# Patient Record
Sex: Female | Born: 1980 | Race: White | Hispanic: No | Marital: Married | Smoking: Former smoker
Health system: Southern US, Community
[De-identification: ages and names within clinical notes are randomized; demographics above are authoritative.]

## PROBLEM LIST (undated history)

## (undated) DIAGNOSIS — F32A Depression, unspecified: Secondary | ICD-10-CM

## (undated) DIAGNOSIS — E785 Hyperlipidemia, unspecified: Secondary | ICD-10-CM

## (undated) DIAGNOSIS — I8290 Acute embolism and thrombosis of unspecified vein: Secondary | ICD-10-CM

## (undated) DIAGNOSIS — F329 Major depressive disorder, single episode, unspecified: Secondary | ICD-10-CM

## (undated) DIAGNOSIS — F419 Anxiety disorder, unspecified: Secondary | ICD-10-CM

## (undated) DIAGNOSIS — K219 Gastro-esophageal reflux disease without esophagitis: Secondary | ICD-10-CM

## (undated) DIAGNOSIS — I10 Essential (primary) hypertension: Secondary | ICD-10-CM

## (undated) DIAGNOSIS — F603 Borderline personality disorder: Secondary | ICD-10-CM

## (undated) DIAGNOSIS — D649 Anemia, unspecified: Secondary | ICD-10-CM

## (undated) DIAGNOSIS — F319 Bipolar disorder, unspecified: Secondary | ICD-10-CM

## (undated) DIAGNOSIS — F4310 Post-traumatic stress disorder, unspecified: Secondary | ICD-10-CM

## (undated) DIAGNOSIS — J45909 Unspecified asthma, uncomplicated: Secondary | ICD-10-CM

## (undated) HISTORY — DX: Anemia, unspecified: D64.9

## (undated) HISTORY — DX: Gastro-esophageal reflux disease without esophagitis: K21.9

## (undated) HISTORY — DX: Borderline personality disorder: F60.3

## (undated) HISTORY — DX: Bipolar disorder, unspecified: F31.9

## (undated) HISTORY — PX: ORIF ANKLE FRACTURE: SUR919

## (undated) HISTORY — PX: OTHER SURGICAL HISTORY: SHX169

## (undated) HISTORY — DX: Major depressive disorder, single episode, unspecified: F32.9

## (undated) HISTORY — DX: Hyperlipidemia, unspecified: E78.5

## (undated) HISTORY — DX: Depression, unspecified: F32.A

## (undated) HISTORY — PX: CHOLECYSTECTOMY: SHX55

---

## 2017-03-30 ENCOUNTER — Other Ambulatory Visit: Payer: Self-pay

## 2017-03-30 ENCOUNTER — Emergency Department (HOSPITAL_COMMUNITY)
Admission: EM | Admit: 2017-03-30 | Discharge: 2017-03-30 | Disposition: A | Discharge status: Home / Self Care | Payer: Self-pay | Attending: Emergency Medicine | Admitting: Emergency Medicine

## 2017-03-30 ENCOUNTER — Emergency Department (HOSPITAL_COMMUNITY): Payer: Self-pay

## 2017-03-30 ENCOUNTER — Encounter (HOSPITAL_COMMUNITY): Payer: Self-pay | Admitting: *Deleted

## 2017-03-30 DIAGNOSIS — J45909 Unspecified asthma, uncomplicated: Secondary | ICD-10-CM | POA: Insufficient documentation

## 2017-03-30 DIAGNOSIS — R0602 Shortness of breath: Secondary | ICD-10-CM | POA: Insufficient documentation

## 2017-03-30 DIAGNOSIS — I10 Essential (primary) hypertension: Secondary | ICD-10-CM | POA: Insufficient documentation

## 2017-03-30 DIAGNOSIS — R0789 Other chest pain: Secondary | ICD-10-CM | POA: Insufficient documentation

## 2017-03-30 DIAGNOSIS — F17210 Nicotine dependence, cigarettes, uncomplicated: Secondary | ICD-10-CM | POA: Insufficient documentation

## 2017-03-30 HISTORY — DX: Anxiety disorder, unspecified: F41.9

## 2017-03-30 HISTORY — DX: Post-traumatic stress disorder, unspecified: F43.10

## 2017-03-30 HISTORY — DX: Essential (primary) hypertension: I10

## 2017-03-30 HISTORY — DX: Acute embolism and thrombosis of unspecified vein: I82.90

## 2017-03-30 HISTORY — DX: Unspecified asthma, uncomplicated: J45.909

## 2017-03-30 LAB — CBC
HEMATOCRIT: 38.4 % (ref 36.0–46.0)
HEMOGLOBIN: 12.5 g/dL (ref 12.0–15.0)
MCH: 28.9 pg (ref 26.0–34.0)
MCHC: 32.6 g/dL (ref 30.0–36.0)
MCV: 88.9 fL (ref 78.0–100.0)
Platelets: 294 10*3/uL (ref 150–400)
RBC: 4.32 MIL/uL (ref 3.87–5.11)
RDW: 13.6 % (ref 11.5–15.5)
WBC: 10.9 10*3/uL — ABNORMAL HIGH (ref 4.0–10.5)

## 2017-03-30 LAB — BASIC METABOLIC PANEL
ANION GAP: 8 (ref 5–15)
BUN: 12 mg/dL (ref 6–20)
CHLORIDE: 103 mmol/L (ref 101–111)
CO2: 26 mmol/L (ref 22–32)
CREATININE: 0.77 mg/dL (ref 0.44–1.00)
Calcium: 9.5 mg/dL (ref 8.9–10.3)
GFR calc non Af Amer: >60 mL/min (ref >60)
Glucose, Bld: 121 mg/dL — ABNORMAL HIGH (ref 65–99)
Potassium: 3.7 mmol/L (ref 3.5–5.1)
Sodium: 137 mmol/L (ref 135–145)

## 2017-03-30 LAB — BRAIN NATRIURETIC PEPTIDE: B NATRIURETIC PEPTIDE 5: 4 pg/mL (ref 0.0–100.0)

## 2017-03-30 LAB — I-STAT TROPONIN, ED
TROPONIN I, POC: 0 ng/mL (ref 0.00–0.08)
Troponin i, poc: 0.02 ng/mL (ref 0.00–0.08)

## 2017-03-30 LAB — I-STAT BETA HCG BLOOD, ED (MC, WL, AP ONLY): I-stat hCG, quantitative: <5 m[IU]/mL (ref <5)

## 2017-03-30 LAB — D-DIMER, QUANTITATIVE (NOT AT ARMC)

## 2017-03-30 MED ORDER — GI COCKTAIL ~~LOC~~
30.0000 mL | Freq: Once | ORAL | Status: AC
  Administered 2017-03-30: 30 mL via ORAL
  Filled 2017-03-30: qty 30

## 2017-03-30 MED ORDER — FAMOTIDINE 20 MG PO TABS
20.0000 mg | ORAL_TABLET | Freq: Once | ORAL | Status: AC
  Administered 2017-03-30: 20 mg via ORAL
  Filled 2017-03-30: qty 1

## 2017-03-30 MED ORDER — OXYCODONE-ACETAMINOPHEN 5-325 MG PO TABS
1.0000 | ORAL_TABLET | Freq: Once | ORAL | Status: AC
  Administered 2017-03-30: 1 via ORAL
  Filled 2017-03-30: qty 1

## 2017-03-30 NOTE — ED Triage Notes (Signed)
Pt c/o left side chest pain and lower back pain with some jaw pain; pt states her left hand "feels funny"

## 2017-03-30 NOTE — ED Provider Notes (Signed)
[facility] EMERGENCY DEPARTMENT Provider Note   CSN: [id number] Arrival date & time: 03/30/17  0000     History   Chief Complaint Chief Complaint  Patient presents with  . Chest Pain    HPI Rita Browning is a 36 y.o. female.  Patient presents to the ER for evaluation of chest pain.  Patient reports that symptoms began 30-45 minutes ago while laying in bed.  Initially it was sharp stabbing pains, now more of a pressure over the left central area of her chest.  She reports some radiation to the neck area as well as a headache.  She has started to have tingling in her left hand.  She feels mildly short of breath.  Patient denies any history of heart disease in her immediate family.  She is a smoker, is obese, has previously been treated for hypertension, but was taken off of her meds.      Past Medical History:  Diagnosis Date  . Anxiety   . Asthma   . Blood clot in vein right leg  . Hypertension   . PTSD (post-traumatic stress disorder)     There are no active problems to display for this patient.   Past Surgical History:  Procedure Laterality Date  . blood clot removed from right leg    . CHOLECYSTECTOMY    . ORIF ANKLE FRACTURE Right     OB History    No data available       Home Medications    Prior to Admission medications   Not on File    Family History History reviewed. No pertinent family history.  Social History Social History   Tobacco Use  . Smoking status: Current Every Day Smoker  . Smokeless tobacco: Never Used  . Tobacco comment: vape  Substance Use Topics  . Alcohol use: No    Frequency: Never  . Drug use: No     Allergies   Patient has no known allergies.   Review of Systems Review of Systems  Respiratory: Positive for shortness of breath.   Cardiovascular: Positive for chest pain.  Gastrointestinal: Positive for nausea.  All other systems reviewed and are negative.    Physical Exam Updated Vital Signs BP 104/61    Pulse 86   Temp 97.9 F (36.6 C) (Oral)   Resp 20   Ht 5\' 8"  (1.727 m)   Wt (!) 149.7 kg (330 lb)   LMP 03/08/2017   SpO2 98%   BMI 50.18 kg/m   Physical Exam  Constitutional: She is oriented to person, place, and time. She appears well-developed and well-nourished. No distress.  HENT:  Head: Normocephalic and atraumatic.  Right Ear: Hearing normal.  Left Ear: Hearing normal.  Nose: Nose normal.  Mouth/Throat: Oropharynx is clear and moist and mucous membranes are normal.  Eyes: Conjunctivae and EOM are normal. Pupils are equal, round, and reactive to light.  Neck: Normal range of motion. Neck supple.  Cardiovascular: Regular rhythm, S1 normal and S2 normal. Exam reveals no gallop and no friction rub.  No murmur heard. Pulmonary/Chest: Effort normal and breath sounds normal. No respiratory distress. She exhibits no tenderness.  Abdominal: Soft. Normal appearance and bowel sounds are normal. There is no hepatosplenomegaly. There is no tenderness. There is no rebound, no guarding, no tenderness at McBurney's point and negative Murphy's sign. No hernia.  Musculoskeletal: Normal range of motion.  Neurological: She is alert and oriented to person, place, and time. She has normal strength. No cranial nerve  deficit or sensory deficit. Coordination normal. GCS eye subscore is 4. GCS verbal subscore is 5. GCS motor subscore is 6.  Skin: Skin is warm, dry and intact. No rash noted. No cyanosis.  Psychiatric: She has a normal mood and affect. Her speech is normal and behavior is normal. Thought content normal.  Nursing note and vitals reviewed.    ED Treatments / Results  Labs (all labs ordered are listed, but only abnormal results are displayed) Labs Reviewed  BASIC METABOLIC PANEL - Abnormal; Notable for the following components:      Result Value   Glucose, Bld 121 (*)    All other components within normal limits  CBC - Abnormal; Notable for the following components:   WBC 10.9 (*)     All other components within normal limits  BRAIN NATRIURETIC PEPTIDE  D-DIMER, QUANTITATIVE (NOT AT [facility])  I-STAT TROPONIN, ED  I-STAT BETA HCG BLOOD, ED ([facility], [facility], [facility] ONLY)  I-STAT TROPONIN, ED    EKG  EKG Interpretation  Date/Time:  Monday March 30 2017 00:09:43 EST Ventricular Rate:  103 PR Interval:    QRS Duration: 82 QT Interval:  348 QTC Calculation: 456 R Axis:   54 Text Interpretation:  Sinus tachycardia Anteroseptal infarct, old Confirmed by Orpah Greek [phone]) on 03/30/2017 12:42:28 AM       Radiology Dg Chest 2 View  Result Date: 03/30/2017 CLINICAL DATA:  36 year old female with chest pain. EXAM: CHEST  2 VIEW COMPARISON:  None. FINDINGS: The heart size and mediastinal contours are within normal limits. Both lungs are clear. The visualized skeletal structures are unremarkable. IMPRESSION: No active cardiopulmonary disease. Electronically Signed   By: Anner Crete M.D.   On: 03/30/2017 01:13    Procedures Procedures (including critical care time)  Medications Ordered in ED Medications  oxyCODONE-acetaminophen (PERCOCET/ROXICET) 5-325 MG per tablet 1 tablet (1 tablet Oral Given 03/30/17 0135)  gi cocktail (Maalox,Lidocaine,Donnatal) (30 mLs Oral Given 03/30/17 0135)  famotidine (PEPCID) tablet 20 mg (20 mg Oral Given 03/30/17 0135)     Initial Impression / Assessment and Plan / ED Course  I have reviewed the triage vital signs and the nursing notes.  Pertinent labs & imaging results that were available during my care of the patient were reviewed by me and considered in my medical decision making (see chart for details).     Patient presents with complaints of chest pain.  She had onset of pain at rest.  Features are mixed typical and possible typical.  She has not had any exertional component, but I cannot reproduce the pain.  Pain started as a sharp pain but then she reported more of a heaviness.  Here in the ER she has having intermittent  paroxysms of pain that only lasts 10 or 15 seconds, atypical for cardiac etiology.  She does not have a family history of heart disease, but does have some history of hypertension.  She does have a history of DVT, but currently does not have any unilateral swelling, tachycardia, tachypnea or hypoxia.  D-dimer is normal.  Pretest probability was felt to be low for PE, this is reassuring that she does not have a PE currently.  Initial troponin and EKG unremarkable.  She is felt to be in the low risk category for cardiac etiology.  She was held in the ER, a second troponin has been performed and is negative.  She is felt to have been adequately worked up here in the ER, does not require hospitalization  for further management.  Can follow-up with primary care.  HEART Score for Major Cardiac Events from [email or web address]  on 03/30/2017 ** All calculations should be rechecked by clinician prior to use **  RESULT SUMMARY: 3 points Low Score (0-3 points)  Risk of MACE of 0.9-1.7%.   INPUTS: History -> 1 = Moderately suspicious EKG -> 0 = Normal Age -> 0 = <45 Risk factors -> 2 = ?3 risk factors or history of atherosclerotic disease Initial troponin -> 0 = ?normal limit  Final Clinical Impressions(s) / ED Diagnoses   Final diagnoses:  Atypical chest pain    ED Discharge Orders    None       Orpah Greek, MD 03/30/17 [phone]

## 2017-06-08 ENCOUNTER — Encounter (HOSPITAL_COMMUNITY): Payer: Self-pay | Admitting: Emergency Medicine

## 2017-06-08 ENCOUNTER — Other Ambulatory Visit: Payer: Self-pay

## 2017-06-08 ENCOUNTER — Emergency Department (HOSPITAL_COMMUNITY)
Admission: EM | Admit: 2017-06-08 | Discharge: 2017-06-09 | Disposition: A | Discharge status: Other Acute Inpatient Hospital | Payer: Self-pay | Attending: Emergency Medicine | Admitting: Emergency Medicine

## 2017-06-08 DIAGNOSIS — R45851 Suicidal ideations: Secondary | ICD-10-CM | POA: Insufficient documentation

## 2017-06-08 DIAGNOSIS — F332 Major depressive disorder, recurrent severe without psychotic features: Secondary | ICD-10-CM | POA: Insufficient documentation

## 2017-06-08 DIAGNOSIS — F17200 Nicotine dependence, unspecified, uncomplicated: Secondary | ICD-10-CM | POA: Insufficient documentation

## 2017-06-08 DIAGNOSIS — J45909 Unspecified asthma, uncomplicated: Secondary | ICD-10-CM | POA: Insufficient documentation

## 2017-06-08 DIAGNOSIS — I10 Essential (primary) hypertension: Secondary | ICD-10-CM | POA: Insufficient documentation

## 2017-06-08 LAB — COMPREHENSIVE METABOLIC PANEL
ALBUMIN: 4.2 g/dL (ref 3.5–5.0)
ALK PHOS: 62 U/L (ref 38–126)
ALT: 92 U/L — AB (ref 14–54)
ANION GAP: 12 (ref 5–15)
AST: 93 U/L — AB (ref 15–41)
BUN: 9 mg/dL (ref 6–20)
CALCIUM: 9.5 mg/dL (ref 8.9–10.3)
CO2: 21 mmol/L — ABNORMAL LOW (ref 22–32)
CREATININE: 0.81 mg/dL (ref 0.44–1.00)
Chloride: 103 mmol/L (ref 101–111)
GFR calc Af Amer: >60 mL/min (ref >60)
GFR calc non Af Amer: >60 mL/min (ref >60)
GLUCOSE: 117 mg/dL — AB (ref 65–99)
Potassium: 3.9 mmol/L (ref 3.5–5.1)
Sodium: 136 mmol/L (ref 135–145)
Total Bilirubin: 0.5 mg/dL (ref 0.3–1.2)
Total Protein: 8.3 g/dL — ABNORMAL HIGH (ref 6.5–8.1)

## 2017-06-08 LAB — ACETAMINOPHEN LEVEL

## 2017-06-08 LAB — RAPID URINE DRUG SCREEN, HOSP PERFORMED
Amphetamines: NOT DETECTED
BARBITURATES: NOT DETECTED
Benzodiazepines: NOT DETECTED
COCAINE: NOT DETECTED
Opiates: NOT DETECTED
TETRAHYDROCANNABINOL: NOT DETECTED

## 2017-06-08 LAB — CBC
HEMATOCRIT: 39.6 % (ref 36.0–46.0)
HEMOGLOBIN: 13.3 g/dL (ref 12.0–15.0)
MCH: 29.4 pg (ref 26.0–34.0)
MCHC: 33.6 g/dL (ref 30.0–36.0)
MCV: 87.6 fL (ref 78.0–100.0)
Platelets: 406 10*3/uL — ABNORMAL HIGH (ref 150–400)
RBC: 4.52 MIL/uL (ref 3.87–5.11)
RDW: 13.9 % (ref 11.5–15.5)
WBC: 8.5 10*3/uL (ref 4.0–10.5)

## 2017-06-08 LAB — SALICYLATE LEVEL: Salicylate Lvl: <7 mg/dL (ref 2.8–30.0)

## 2017-06-08 LAB — PREGNANCY, URINE: Preg Test, Ur: NEGATIVE

## 2017-06-08 LAB — ETHANOL: Alcohol, Ethyl (B): <10 mg/dL (ref <10)

## 2017-06-08 NOTE — ED Notes (Signed)
Pt's belongings and purse placed in psych lockers.

## 2017-06-08 NOTE — ED Notes (Signed)
Security called to wand pt  

## 2017-06-08 NOTE — ED Provider Notes (Signed)
[facility] EMERGENCY DEPARTMENT Provider Note   CSN: [id number] Arrival date & time: 06/08/17  1416     History   Chief Complaint Chief Complaint  Patient presents with  . V70.1    HPI Rita Browning is a 37 y.o. female.  HPI  The patient is a 37 year old female, she has a known history of anxiety depression and posttraumatic stress disorder which she has suffered with for the last 10 years.  She has been following with a therapist recently because of seemingly uncontrolled anxiety and depression and when she went to the office today complaining of increasing suicidal thoughts including putting a gun to her head or her chest (has a gun at home) her therapist sent her to the emergency department for evaluation.  The patient does report having a suicide attempt about a year and a half ago where she was going to overdose until her roommate stopped it.  She reports that she has been having thoughts recently of increasing suicidal nature stating that she thinks other people would be better off without her, she reports that her significant other who is here with her has been staying with her 24 hours a day to prevent her from doing something dangerous.  She does not have any visual hallucinations but states that she feels like voices are telling her to tell us that everything is okay so she can leave and finish her suicidal act.  She denies alcohol or drug use, she does not smoke cigarettes but currently vapes  Past Medical History:  Diagnosis Date  . Anxiety   . Asthma   . Blood clot in vein right leg  . Hypertension   . PTSD (post-traumatic stress disorder)     There are no active problems to display for this patient.   Past Surgical History:  Procedure Laterality Date  . blood clot removed from right leg    . CHOLECYSTECTOMY    . ORIF ANKLE FRACTURE Right     OB History    No data available       Home Medications    Prior to Admission medications   Not on File     Family History History reviewed. No pertinent family history.  Social History Social History   Tobacco Use  . Smoking status: Current Every Day Smoker  . Smokeless tobacco: Never Used  . Tobacco comment: vape  Substance Use Topics  . Alcohol use: No    Frequency: Never  . Drug use: No     Allergies   Codeine   Review of Systems Review of Systems  All other systems reviewed and are negative.    Physical Exam Updated Vital Signs BP 120/74 (BP Location: Right Arm)   Pulse 92   Temp 98.2 F (36.8 C) (Oral)   Resp 18   Ht 5\' 8"  (1.727 m)   Wt (!) 150.1 kg (331 lb)   LMP 06/08/2017   SpO2 99%   BMI 50.33 kg/m   Physical Exam  Constitutional: She appears well-developed and well-nourished. No distress.  HENT:  Head: Normocephalic and atraumatic.  Mouth/Throat: Oropharynx is clear and moist. No oropharyngeal exudate.  Eyes: Conjunctivae and EOM are normal. Pupils are equal, round, and reactive to light. Right eye exhibits no discharge. Left eye exhibits no discharge. No scleral icterus.  Neck: Normal range of motion. Neck supple. No JVD present. No thyromegaly present.  Cardiovascular: Normal rate, regular rhythm, normal heart sounds and intact distal pulses. Exam reveals no gallop and  no friction rub.  No murmur heard. Pulmonary/Chest: Effort normal and breath sounds normal. No respiratory distress. She has no wheezes. She has no rales.  Abdominal: Soft. Bowel sounds are normal. She exhibits no distension and no mass. There is no tenderness.  Musculoskeletal: Normal range of motion. She exhibits no edema or tenderness.  Lymphadenopathy:    She has no cervical adenopathy.  Neurological: She is alert. Coordination normal.  Skin: Skin is warm and dry. No rash noted. No erythema.  Psychiatric:  Flat affect Depressed appearance Not responding to internal stimuli No pressured speech Clear train of thought.  Nursing note and vitals reviewed.    ED Treatments  / Results  Labs (all labs ordered are listed, but only abnormal results are displayed) Labs Reviewed  COMPREHENSIVE METABOLIC PANEL - Abnormal; Notable for the following components:      Result Value   CO2 21 (*)    Glucose, Bld 117 (*)    Total Protein 8.3 (*)    AST 93 (*)    ALT 92 (*)    All other components within normal limits  ACETAMINOPHEN LEVEL - Abnormal; Notable for the following components:   Acetaminophen (Tylenol), Serum <10 (*)    All other components within normal limits  CBC - Abnormal; Notable for the following components:   Platelets 406 (*)    All other components within normal limits  ETHANOL  SALICYLATE LEVEL  RAPID URINE DRUG SCREEN, HOSP PERFORMED  PREGNANCY, URINE    EKG  EKG Interpretation None       Radiology No results found.  Procedures Procedures (including critical care time)  Medications Ordered in ED Medications - No data to display   Initial Impression / Assessment and Plan / ED Course  I have reviewed the triage vital signs and the nursing notes.  Pertinent labs & imaging results that were available during my care of the patient were reviewed by me and considered in my medical decision making (see chart for details).  Clinical Course as of Jun 08 2157  Mon Jun 08, 2017  1711 The lab work has returned, there is no acute findings to suggest the need for medical stabilizing care.  She is medically cleared to speak with psychiatry as of 5:00 PM  [BM]    Clinical Course User Index [BM] Noemi Chapel, MD    The patient is actively suicidal with a plan, she will need to be admitted to a psychiatric institution for stabilizing care, until then she will remain here under psychiatric care in the emergency department with a sitter at the bedside under suicide precautions.   Final Clinical Impressions(s) / ED Diagnoses   Final diagnoses:  Suicidal thoughts  Severe episode of recurrent major depressive disorder, without psychotic  features ([place])      Noemi Chapel, MD 06/08/17 2159
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
## 2017-06-08 NOTE — ED Notes (Signed)
Pt visitor was given pt purse,cellphone, silver wedding band, clothes, shoes. Pt family member reported would bring pt belongings home with her at time of leaving ED. Pt belongings currently at nurses station with Primary RN. Sitter and Primary RN aware.

## 2017-06-08 NOTE — ED Notes (Signed)
Pt given phone to use 

## 2017-06-08 NOTE — ED Triage Notes (Signed)
Pt reports suicidal thoughts since Thursday. Pt reports was seen by psychiatrist today and sent over for evaluation. Pt reports "I know how to do it the right way, if someone would just leave me alone." pt family reports has removed guns from home. Pt tearful and sad in triage. Pt denies AVH/HI.

## 2017-06-08 NOTE — ED Notes (Signed)
Informed pt that [facility] adult unit was currently full per [facility] Flow and that they were seeking placement for her.

## 2017-06-09 ENCOUNTER — Inpatient Hospital Stay (HOSPITAL_COMMUNITY)
Admission: AD | Admit: 2017-06-09 | Discharge: 2017-06-12 | DRG: 885 | Disposition: A | Discharge status: Home / Self Care | Payer: Federal, State, Local not specified - Other | Source: Intra-hospital | Attending: Psychiatry | Admitting: Psychiatry

## 2017-06-09 ENCOUNTER — Encounter (HOSPITAL_COMMUNITY): Payer: Self-pay

## 2017-06-09 DIAGNOSIS — F3181 Bipolar II disorder: Secondary | ICD-10-CM | POA: Diagnosis not present

## 2017-06-09 DIAGNOSIS — F332 Major depressive disorder, recurrent severe without psychotic features: Secondary | ICD-10-CM | POA: Diagnosis present

## 2017-06-09 DIAGNOSIS — R45851 Suicidal ideations: Secondary | ICD-10-CM | POA: Diagnosis present

## 2017-06-09 DIAGNOSIS — F17290 Nicotine dependence, other tobacco product, uncomplicated: Secondary | ICD-10-CM | POA: Diagnosis present

## 2017-06-09 DIAGNOSIS — Z62810 Personal history of physical and sexual abuse in childhood: Secondary | ICD-10-CM | POA: Diagnosis present

## 2017-06-09 DIAGNOSIS — Z885 Allergy status to narcotic agent status: Secondary | ICD-10-CM

## 2017-06-09 DIAGNOSIS — Z818 Family history of other mental and behavioral disorders: Secondary | ICD-10-CM | POA: Diagnosis not present

## 2017-06-09 DIAGNOSIS — F4310 Post-traumatic stress disorder, unspecified: Secondary | ICD-10-CM | POA: Diagnosis present

## 2017-06-09 DIAGNOSIS — F419 Anxiety disorder, unspecified: Secondary | ICD-10-CM | POA: Diagnosis present

## 2017-06-09 DIAGNOSIS — I10 Essential (primary) hypertension: Secondary | ICD-10-CM | POA: Diagnosis present

## 2017-06-09 MED ORDER — TRAZODONE HCL 50 MG PO TABS
50.0000 mg | ORAL_TABLET | Freq: Every evening | ORAL | Status: DC | PRN
  Administered 2017-06-10 – 2017-06-11 (×2): 50 mg via ORAL
  Filled 2017-06-09 (×8): qty 1

## 2017-06-09 MED ORDER — PRAZOSIN HCL 2 MG PO CAPS
2.0000 mg | ORAL_CAPSULE | Freq: Every day | ORAL | Status: DC
  Filled 2017-06-09: qty 1
  Filled 2017-06-09: qty 2
  Filled 2017-06-09 (×3): qty 1

## 2017-06-09 MED ORDER — ACETAMINOPHEN 325 MG PO TABS: 650.0000 mg | ORAL_TABLET | Freq: Four times a day (QID) | ORAL | Status: DC | PRN

## 2017-06-09 MED ORDER — MAGNESIUM HYDROXIDE 400 MG/5ML PO SUSP: 30.0000 mL | Freq: Every day | ORAL | Status: DC | PRN

## 2017-06-09 MED ORDER — HYDROXYZINE HCL 25 MG PO TABS
25.0000 mg | ORAL_TABLET | Freq: Four times a day (QID) | ORAL | Status: DC | PRN
  Filled 2017-06-09: qty 10

## 2017-06-09 MED ORDER — LITHIUM CARBONATE 300 MG PO CAPS
900.0000 mg | ORAL_CAPSULE | Freq: Every day | ORAL | Status: DC
  Administered 2017-06-09: 900 mg via ORAL
  Filled 2017-06-09 (×4): qty 3

## 2017-06-09 MED ORDER — ALUM & MAG HYDROXIDE-SIMETH 200-200-20 MG/5ML PO SUSP: 30.0000 mL | ORAL | Status: DC | PRN

## 2017-06-09 NOTE — BHH Suicide Risk Assessment (Signed)
[facility] Admission Suicide Risk Assessment   Nursing information obtained from:    Demographic factors:    Current Mental Status:    Loss Factors:    Historical Factors:    Risk Reduction Factors:     Total Time spent with patient: 45 minutes Principal Problem: <principal problem not specified> Diagnosis:   Patient Active Problem List   Diagnosis Date Noted  . MDD (major depressive disorder), recurrent episode, severe ([place]) [F33.2] 06/09/2017   Subjective Data:  37 y.o Caucasian female, unemployed, lives with her partner. Background history of  Bipolar Disorder, early life trauma and self mutilation. Presented to the ER voluntarily in company of her partner. Expressed worsening depression in the past couple of days. Has been having more severe suicidal thoughts. Has thought about shooting herself, taking and OD and cutting herself. Family has secured all the weapons in the house. Main stressor is not being able to hold a job. Routine labs significant for mildly elevated AST and ALT, thrombocytosis. Toxicology is negative. UDS is is negative. BAL <10 mg/dl. History of early life trauma. History of self mutilation in the past. No past suicidal behavior, no family history of suicide, no evidence of psychosis. No evidence of mania. No cognitive impairment. No access to weapons. She is cooperative with care. She has agreed to treatment recommendations. She has agreed to communicate suicidal thoughts to staff if the thoughts becomes overwhelming.      Continued Clinical Symptoms:    The "Alcohol Use Disorders Identification Test", Guidelines for Use in Primary Care, Second Edition.  World Pharmacologist [facility]). Score between 0-7:  no or low risk or alcohol related problems. Score between 8-15:  moderate risk of alcohol related problems. Score between 16-19:  high risk of alcohol related problems. Score 20 or above:  warrants further diagnostic evaluation for alcohol dependence and  treatment.   CLINICAL FACTORS:   Bipolar Disorder:   Mixed State   Musculoskeletal: Strength & Muscle Tone: within normal limits Gait & Station: normal Patient leans: N/A  Psychiatric Specialty Exam: Physical Exam  ROS  Blood pressure 119/80, pulse (!) 102, temperature (!) 97.5 F (36.4 C), temperature source Oral, resp. rate 18, height 5' 7.99" (1.727 m), weight (!) 150.1 kg (330 lb 14.6 oz), last menstrual period 06/08/2017, SpO2 100 %.Body mass index is 50.33 kg/m.  General Appearance: As in H&P  Eye Contact:    Speech:    Volume:    Mood:    Affect:    Thought Process:    Orientation:    Thought Content:    Suicidal Thoughts:    Homicidal Thoughts:    Memory:    Judgement:  As in H&P  Insight:    Psychomotor Activity:    Concentration:    Recall:    Fund of Knowledge:    Language:    Akathisia:    Handed:    AIMS (if indicated):     Assets:    ADL's:    Cognition:  As in H&P  Sleep:  Number of Hours: 3.25      COGNITIVE FEATURES THAT CONTRIBUTE TO RISK:      SUICIDE RISK:   Moderate:  Frequent suicidal ideation with limited intensity, and duration, some specificity in terms of plans, no associated intent, good self-control, limited dysphoria/symptomatology, some risk factors present, and identifiable protective factors, including available and accessible social support.  PLAN OF CARE:  As in H&P  I certify that inpatient services furnished can reasonably be expected  to improve the patient's condition.   Artist Beach, MD 06/09/2017, 4:31 PM

## 2017-06-09 NOTE — Progress Notes (Signed)
Recreation Therapy Notes  Date: 2.12.19 Time: 2:45 pm  Location: [address]   AAA/T Program Assumption of Risk Form signed by Patient/ or Parent Legal Guardian Yes  Patient is free of allergies or sever asthma Yes  Patient reports no fear of animals Yes  Patient reports no history of cruelty to animals Yes  Patient understands his/her participation is voluntary Yes  Patient washes hands before animal contact Yes  Patient washes hands after animal contact Yes  Behavioral Response: Engaged   Education:Hand Washing, Appropriate Animal Interaction   Education Outcome: Acknowledges education.   Clinical Observations/Feedback: Patient attended session and interacted appropriately with therapy dog and peers. Patient asked appropriate questions about therapy dog and his training. Patient shared stories about their pets at home with group.   Ranell Patrick, Recreation Therapy Intern   Ranell Patrick 06/09/2017 8:47 AM

## 2017-06-09 NOTE — ED Notes (Signed)
Pt just left for [facility] with [facility] transport. Report called to [place] @ [facility].
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
## 2017-06-09 NOTE — BHH Group Notes (Signed)
[place] Group Notes:  (Nursing/MHT/Case Management/Adjunct)  Date:  06/09/2017  Time:  5:34 PM  Type of Therapy:  Nurse Education  Participation Level:  Active  Participation Quality:  Appropriate  Affect:  Appropriate  Cognitive:  Appropriate  Insight:  Appropriate  Engagement in Group:  Engaged  Modes of Intervention:  Activity and Education  Summary of Progress/Problems:  This was a Psychoeducational group specifically related to the use of the therapy ball.    Cheri Kearns 06/09/2017, 5:34 PM
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
## 2017-06-09 NOTE — BHH Group Notes (Signed)
Adult Psychoeducational Group Note  Date:  06/09/2017 Time:  9:01 AM  Group Topic/Focus:  Goals Group:   The focus of this group is to help patients establish daily goals to achieve during treatment and discuss how the patient can incorporate goal setting into their daily lives to aide in recovery.  Participation Level:  Active  Participation Quality:  Appropriate  Affect:  Appropriate  Cognitive:  Alert  Insight: Appropriate  Engagement in Group:  Engaged  Modes of Intervention:  Orientation  Additional Comments:  Pt was alert and engaged in group. Pt goal for today is to "survive the day".   Huel Cote 06/09/2017, 9:01 AM

## 2017-06-09 NOTE — Progress Notes (Addendum)
D: Patient is irritable and sarcastic.  She states, "Well, what do I do today?  Just lay around?"  Patient was informed they would announce groups at different times during the day.  She states, "what fun, fun."  Patient presents superficially; she is sullen.  She denies any thoughts of self harm.  Patient is considerably more pleasant this afternoon.  She is concerned about taking lithium.  She states, "I'm willing to stay longer if they need to draw my labs.  I'm still having thoughts of hurting myself."  A: Continue to monitor medication management and MD orders.  Safety checks continued every 15 minutes per protocol.  Offer support and encouragement as needed.  R: Patient is encouraged to attend groups and participate in her treatment.

## 2017-06-09 NOTE — BHH Group Notes (Signed)
LCSW Group Therapy Note 06/09/2017 2:56 PM  Type of Therapy/Topic: Group Therapy: Feelings about Diagnosis  Participation Level: Active   Description of Group:  This group will allow patients to explore their thoughts and feelings about diagnoses they have received. Patients will be guided to explore their level of understanding and acceptance of these diagnoses. Facilitator will encourage patients to process their thoughts and feelings about the reactions of others to their diagnosis and will guide patients in identifying ways to discuss their diagnosis with significant others in their lives. This group will be process-oriented, with patients participating in exploration of their own experiences, giving and receiving support, and processing challenge from other group members.  Therapeutic Goals: 1. Patient will demonstrate understanding of diagnosis as evidenced by identifying two or more symptoms of the disorder 2. Patient will be able to express two feelings regarding the diagnosis 3. Patient will demonstrate their ability to communicate their needs through discussion and/or role play  Summary of Patient Progress:  Rita Browning was engaged throughout the group's session. Rita Browning participated and contributed to the group's discussion. Rita Browning states that she recommends that mental health should be implemented in the school system as a way to better educate society on mental health diagnosis and mental health issues.   Therapeutic Modalities:  Cognitive Behavioral Therapy Brief Therapy Feelings Identification    [place] Clinical Social Worker

## 2017-06-09 NOTE — Progress Notes (Signed)
Nursing Progress Note: 7p-7a D: Pt currently presents with a anxious/pleasant affect and behavior. Pt states "I feel like that Lithium dose is really high. I'm nervous about that because I've heard of people OD'ing on it. I'm just glad I'm here where I couldn't have the option to OD." Interacting appropriately with the milieu. Pt reports good sleep during the previous night with current medication regimen. Pt did attend wrap-up group.  A: Pt provided with medications per providers orders. Pt's labs and vitals were monitored throughout the night. Pt supported emotionally and encouraged to express concerns and questions. Pt educated on medications.  R: Pt's safety ensured with 15 minute and environmental checks. Pt currently denies SI, HI, and AVH. Pt verbally contracts to seek staff if SI,HI, or AVH occurs and to consult with staff before acting on any harmful thoughts. Will continue to monitor.

## 2017-06-09 NOTE — Tx Team (Signed)
Initial Treatment Plan 06/09/2017 2:17 AM Gara Kroner MMN:[id number]    PATIENT STRESSORS: Health problems   PATIENT STRENGTHS: Ability for insight Average or above average intelligence Capable of independent living   PATIENT IDENTIFIED PROBLEMS: "medications to stabilize mood"  "Coping skills for depression and suicidal thoughts"                   DISCHARGE CRITERIA:  Ability to meet basic life and health needs Adequate post-discharge living arrangements Improved stabilization in mood, thinking, and/or behavior  PRELIMINARY DISCHARGE PLAN: Attend aftercare/continuing care group  PATIENT/FAMILY INVOLVEMENT: This treatment plan has been presented to and reviewed with the patient, Rita Browning.  The patient and family have been given the opportunity to ask questions and make suggestions.  Gwendolyn Fill, RN 06/09/2017, 2:17 AM

## 2017-06-09 NOTE — BHH Group Notes (Signed)
Adult Psychoeducational Group Note  Date:  06/09/2017 Time:  7:15 PM  Group Topic/Focus:  Activity  Participation Level:  Active  Additional Comments:  Pt participated in [facility] activity    Huel Cote 06/09/2017, 7:15 PM

## 2017-06-10 DIAGNOSIS — F3181 Bipolar II disorder: Secondary | ICD-10-CM

## 2017-06-10 LAB — TSH: TSH: 2.019 u[IU]/mL (ref 0.350–4.500)

## 2017-06-10 MED ORDER — RISPERIDONE 0.5 MG PO TABS
0.5000 mg | ORAL_TABLET | Freq: Every day | ORAL | Status: DC
  Administered 2017-06-10: 0.5 mg via ORAL
  Filled 2017-06-10 (×2): qty 1

## 2017-06-10 NOTE — Progress Notes (Signed)
Recreation Therapy Notes  Date: 06/10/17 Time: 0930 Location: [address] Dayroom  Group Topic: Stress Management  Goal Area(s) Addresses:  Patient will verbalize importance of using healthy stress management.  Patient will identify positive emotions associated with healthy stress management.   Intervention: Stress Management  Activity :  LRT introduced the stress management technique of guided imagery.  LRT read a script to guide patients to envision their peaceful place. Patients were to follow along as LRT read script.  Education:  Stress Management, Discharge Planning.   Education Outcome: Acknowledges edcuation/In group clarification offered/Needs additional education  Clinical Observations/Feedback: Pt did not attend group.    Victorino Sparrow, LRT/CTRS         Victorino Sparrow A 06/10/2017 11:38 AM

## 2017-06-10 NOTE — Progress Notes (Signed)
D: Patient is bright with pleasant mood.  She was concerned about taking the lithium and did not want to take it again.  Patient was taken off the lithium.  Patient denies any thoughts of self harm today.  She states, "I just want to get better."  She frequently asks about her diagnosis.  She rates her depression as a 5; hopelessness and anxiety as a 2.  She is sleeping and eating well; her energy level is low and her concentration is poor.  She is attending groups and is an active participant.  A: Continue to monitor medication management and MD orders.  Safety checks continued every 15 minutes per protocol.  Offer encouragement and support as needed.  R: Patient is receptive to staff; her behavior is appropriate.

## 2017-06-10 NOTE — BHH Group Notes (Signed)
[facility] Mental Health Association Group Therapy      06/10/2017 12:13 PM  Type of Therapy: Mental Health Association Presentation  Participation Level: Active  Participation Quality: Attentive  Affect: Appropriate  Cognitive: Oriented  Insight: Developing/Improving  Engagement in Therapy: Engaged  Modes of Intervention: Discussion, Education and Socialization  Summary of Progress/Problems: [place] ([place]) Speaker came to talk about his personal journey with mental health. The pt processed ways by which to relate to the speaker. [place] speaker provided handouts and educational information pertaining to groups and services offered by the [facility]. Pt was engaged in speaker's presentation and was receptive to resources provided.    [place] Social Worker

## 2017-06-10 NOTE — BHH Group Notes (Signed)
[place] Group Notes:  (Nursing/MHT/Case Management/Adjunct)  Date:  06/10/2017  Time:  3:58 PM  Type of Therapy:  Nurse Education  Participation Level:  Active  Participation Quality:  Monopolizing  Affect:  Angry, Depressed and Excited  Cognitive:  Alert and Appropriate  Insight:  Lacking  Engagement in Group:  Defensive, Monopolizing and Off Topic  Modes of Intervention:  Activity, Discussion and Education  Summary of Progress/Problems:  This was an educational group focusing on the importance of training the brain with positive messages and replacing negative messages, especially those that  are deeply imprinted and play without active thought.    Cheri Kearns 06/10/2017, 3:58 PM

## 2017-06-10 NOTE — Progress Notes (Signed)
[facility] MD Progress Note  06/10/2017 3:12 PM Rita Browning  MRN:  [id number]   Subjective:  Patient reports that she is doing ok today. She reports that she did not like how the Lithium made her feel. "My heart felt like it was racing and I just didn't like it."   Objective: Patient's chart and findings reviewed and discussed with treatment team. Patient presents in the day room and is pleasant and cooperative. Since the patient doesn't like the Lithium it will be discontinued. Discussed options of Depakote and Risperidone for mood stability. She chooses Risperidone due to continued labs and Risperidone is on the $4 list at [facility]. Will continue Prazosin QHS and Vistaril and Trazodone PRN. Also TSH has not been checked. Will order TSH draw for tonight.  Principal Problem: Bipolar II disorder ([place]) Diagnosis:   Patient Active Problem List   Diagnosis Date Noted  . Bipolar II disorder ([place]) [F31.81] 06/09/2017   Total Time spent with patient: 25 minutes  Past Psychiatric History: See H&P  Past Medical History:  Past Medical History:  Diagnosis Date  . Anxiety   . Asthma   . Blood clot in vein right leg  . Hypertension   . PTSD (post-traumatic stress disorder)     Past Surgical History:  Procedure Laterality Date  . blood clot removed from right leg    . CHOLECYSTECTOMY    . ORIF ANKLE FRACTURE Right    Family History: History reviewed. No pertinent family history. Family Psychiatric  History: See H&P Social History:  Social History   Substance and Sexual Activity  Alcohol Use No  . Frequency: Never     Social History   Substance and Sexual Activity  Drug Use No    Social History   Socioeconomic History  . Marital status: Single    Spouse name: None  . Number of children: None  . Years of education: None  . Highest education level: None  Social Needs  . Financial resource strain: None  . Food insecurity - worry: None  . Food insecurity - inability: None  .  Transportation needs - medical: None  . Transportation needs - non-medical: None  Occupational History  . None  Tobacco Use  . Smoking status: Current Every Day Smoker    Types: E-cigarettes  . Smokeless tobacco: Never Used  . Tobacco comment: vape  Substance and Sexual Activity  . Alcohol use: No    Frequency: Never  . Drug use: No  . Sexual activity: None  Other Topics Concern  . None  Social History Narrative  . None   Additional Social History:                         Sleep: Good  Appetite:  Good  Current Medications: Current Facility-Administered Medications  Medication Dose Route Frequency Provider Last Rate Last Dose  . acetaminophen (TYLENOL) tablet 650 mg  650 mg Oral Q6H PRN Laverle Hobby, PA-C      . alum & mag hydroxide-simeth (MAALOX/MYLANTA) 200-200-20 MG/5ML suspension 30 mL  30 mL Oral Q4H PRN Laverle Hobby, PA-C      . hydrOXYzine (ATARAX/VISTARIL) tablet 25 mg  25 mg Oral Q6H PRN Patriciaann Clan E, PA-C      . magnesium hydroxide (MILK OF MAGNESIA) suspension 30 mL  30 mL Oral Daily PRN Patriciaann Clan E, PA-C      . prazosin (MINIPRESS) capsule 2 mg  2 mg Oral QHS Izediuno,  Laruth Bouchard, MD      . risperiDONE (RISPERDAL) tablet 0.5 mg  0.5 mg Oral QHS Annaleia Pence, Lowry Ram, FNP      . traZODone (DESYREL) tablet 50 mg  50 mg Oral QHS,MR X 1 Laverle Hobby, PA-C        Lab Results:  Results for orders placed or performed during the hospital encounter of 06/08/17 (from the past 48 hour(s))  Rapid urine drug screen (hospital performed)     Status: None   Collection Time: 06/08/17  3:40 PM  Result Value Ref Range   Opiates NONE DETECTED NONE DETECTED   Cocaine NONE DETECTED NONE DETECTED   Benzodiazepines NONE DETECTED NONE DETECTED   Amphetamines NONE DETECTED NONE DETECTED   Tetrahydrocannabinol NONE DETECTED NONE DETECTED   Barbiturates NONE DETECTED NONE DETECTED    Comment: (NOTE) DRUG SCREEN FOR MEDICAL PURPOSES ONLY.  IF CONFIRMATION IS  NEEDED FOR ANY PURPOSE, NOTIFY LAB WITHIN 5 DAYS. LOWEST DETECTABLE LIMITS FOR URINE DRUG SCREEN Drug Class                     Cutoff (ng/mL) Amphetamine and metabolites    1000 Barbiturate and metabolites    200 Benzodiazepine                 323 Tricyclics and metabolites     300 Opiates and metabolites        300 Cocaine and metabolites        300 THC                            50 Performed at [facility], [address], [place], [place] [address]   Pregnancy, urine     Status: None   Collection Time: 06/08/17  3:40 PM  Result Value Ref Range   Preg Test, Ur NEGATIVE NEGATIVE    Comment:        THE SENSITIVITY OF THIS METHODOLOGY IS >20 mIU/mL. Performed at [facility], [address], [place], [place] [address]     Blood Alcohol level:  Lab Results  Component Value Date   ETH <10 27/09/2374    Metabolic Disorder Labs: No results found for: HGBA1C, MPG No results found for: PROLACTIN No results found for: CHOL, TRIG, HDL, CHOLHDL, VLDL, LDLCALC  Physical Findings: AIMS:  , ,  ,  ,    CIWA:    COWS:     Musculoskeletal: Strength & Muscle Tone: within normal limits Gait & Station: normal Patient leans: N/A  Psychiatric Specialty Exam: Physical Exam  Nursing note and vitals reviewed. Constitutional: She is oriented to person, place, and time. She appears well-developed and well-nourished.  Cardiovascular: Normal rate.  Respiratory: Effort normal.  Musculoskeletal: Normal range of motion.  Neurological: She is alert and oriented to person, place, and time.  Skin: Skin is warm.    Review of Systems  Constitutional: Negative.   HENT: Negative.   Eyes: Negative.   Respiratory: Negative.   Cardiovascular: Negative.   Gastrointestinal: Negative.   Genitourinary: Negative.   Musculoskeletal: Positive for back pain.  Skin: Negative.   Neurological: Negative.   Endo/Heme/Allergies: Negative.   Psychiatric/Behavioral: Positive for depression. Negative for  hallucinations and suicidal ideas. The patient is nervous/anxious.     Blood pressure 127/82, pulse 94, temperature 98.1 F (36.7 C), temperature source Oral, resp. rate 18, height 5' 7.99" (1.727 m), weight (!) 150.1 kg (330 lb 14.6 oz),  last menstrual period 06/08/2017, SpO2 100 %.Body mass index is 50.33 kg/m.  General Appearance: Casual  Eye Contact:  Good  Speech:  Clear and Coherent and Normal Rate  Volume:  Normal  Mood:  Depressed  Affect:  Congruent  Thought Process:  Goal Directed and Descriptions of Associations: Intact  Orientation:  Full (Time, Place, and Person)  Thought Content:  WDL  Suicidal Thoughts:  No  Homicidal Thoughts:  No  Memory:  Immediate;   Good Recent;   Good Remote;   Good  Judgement:  Good  Insight:  Good  Psychomotor Activity:  Normal  Concentration:  Concentration: Good and Attention Span: Good  Recall:  Good  Fund of Knowledge:  Good  Language:  Good  Akathisia:  No  Handed:  Right  AIMS (if indicated):     Assets:  Communication Skills Desire for Improvement Financial Resources/Insurance Housing Physical Health Social Support Transportation  ADL's:  Intact  Cognition:  WNL  Sleep:  Number of Hours: 6.75   Problems Addressed: Bipolar II  Treatment Plan Summary: Daily contact with patient to assess and evaluate symptoms and progress in treatment, Medication management and Plan is to:  -Discontinue Lithium and lithium level lab -Start Risperidone 0.5 mg PO QHS for mood stability -Continue Trazodone 50 mg PO QHS PRN for insomnia -Continue Prazosin 2 mg PO QHS for PTSD symptoms -Continue Vistaril 25 mg PO Q6H PRN for anxiety -Order TSH lab draw -Encourage group therapy participation  Lewis Shock, FNP 06/10/2017, 3:12 PM

## 2017-06-10 NOTE — B[facility] Suicide Risk Assessment (Signed)
[place] INPATIENT:  Family/Significant Other Suicide Prevention Education  Suicide Prevention Education:  Education Completed; Shirlee More [phone] [facility]) has been identified by the patient as the family member/significant other with whom the patient will be residing, and identified as the person(s) who will aid the patient in the event of a mental health crisis (suicidal ideations/suicide attempt).  With written consent from the patient, the family member/significant other has been provided the following suicide prevention education, prior to the and/or following the discharge of the patient.  The suicide prevention education provided includes the following:  Suicide risk factors  Suicide prevention and interventions  National Suicide Hotline telephone number  [facility] assessment telephone number  [facility] Emergency Assistance [place] and/or Residential Mobile Crisis Unit telephone number  Request made of family/significant other to:  Remove weapons (e.g., guns, rifles, knives), all items previously/currently identified as safety concern.    Remove drugs/medications (over-the-counter, prescriptions, illicit drugs), all items previously/currently identified as a safety concern.  The family member/significant other verbalizes understanding of the suicide prevention education information provided.  The family member/significant other agrees to remove the items of safety concern listed above.  Stacie Collins reports that Roselle has been experiencing suicidal thoughts for at least four months.  She states that Lennyx's friends have told her that these thoughts are a regular occurrence for Kinga and that this has been going on for many years.  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  Stacie Collins reports that Jamicia has  been experiencing frequent nightmares about suicide.  She also reports that there appears to be no triggers for Embrie's suicidal thoughts but is aware that there is past family trauma in [place] past.  All weapons in the home have been removed or locked in a secure area.  Suicide prevention was discussed.    Frutoso Chase Barrie Wale 06/10/2017, 1:44 PM

## 2017-06-10 NOTE — Progress Notes (Signed)
Pt attend wrap up group. Her day was a 7. Her goal surive and she accomplish her goal.

## 2017-06-10 NOTE — Tx Team (Signed)
Interdisciplinary Treatment and Diagnostic Plan Update  06/10/2017 Time of Session: 10:00a Rita Browning MRN: [id number]  Principal Diagnosis: Bipolar II disorder ([place])  Secondary Diagnoses: Principal Problem:   Bipolar II disorder ([place])   Current Medications:  Current Facility-Administered Medications  Medication Dose Route Frequency Provider Last Rate Last Dose  . acetaminophen (TYLENOL) tablet 650 mg  650 mg Oral Q6H PRN Laverle Hobby, PA-C      . alum & mag hydroxide-simeth (MAALOX/MYLANTA) 200-200-20 MG/5ML suspension 30 mL  30 mL Oral Q4H PRN Laverle Hobby, PA-C      . hydrOXYzine (ATARAX/VISTARIL) tablet 25 mg  25 mg Oral Q6H PRN Patriciaann Clan E, PA-C      . lithium carbonate capsule 900 mg  900 mg Oral QHS Izediuno, Laruth Bouchard, MD   900 mg at 06/09/17 2123  . magnesium hydroxide (MILK OF MAGNESIA) suspension 30 mL  30 mL Oral Daily PRN Patriciaann Clan E, PA-C      . prazosin (MINIPRESS) capsule 2 mg  2 mg Oral QHS Izediuno, Vincent A, MD      . traZODone (DESYREL) tablet 50 mg  50 mg Oral QHS,MR X 1 Simon, Spencer E, PA-C       PTA Medications: No medications prior to admission.    Patient Stressors: Health problems  Patient Strengths: Ability for insight Average or above average intelligence Capable of independent living  Treatment Modalities: Medication Management, Group therapy, Case management,  1 to 1 session with clinician, Psychoeducation, Recreational therapy.   Physician Treatment Plan for Primary Diagnosis: Bipolar II disorder ([place]) Long Term Goal(s): Improvement in symptoms so as ready for discharge Improvement in symptoms so as ready for discharge   Short Term Goals: Ability to identify changes in lifestyle to reduce recurrence of condition will improve Ability to verbalize feelings will improve Ability to disclose and discuss suicidal ideas Ability to demonstrate self-control will improve Ability to identify and develop effective coping behaviors  will improve Ability to maintain clinical measurements within normal limits will improve Compliance with prescribed medications will improve Ability to identify changes in lifestyle to reduce recurrence of condition will improve Ability to verbalize feelings will improve Ability to disclose and discuss suicidal ideas Ability to demonstrate self-control will improve Ability to identify and develop effective coping behaviors will improve Ability to maintain clinical measurements within normal limits will improve Compliance with prescribed medications will improve  Medication Management: Evaluate patient's response, side effects, and tolerance of medication regimen.  Therapeutic Interventions: 1 to 1 sessions, Unit Group sessions and Medication administration.  Evaluation of Outcomes: Not Met  Physician Treatment Plan for Secondary Diagnosis: Principal Problem:   Bipolar II disorder ([place])  Long Term Goal(s): Improvement in symptoms so as ready for discharge Improvement in symptoms so as ready for discharge   Short Term Goals: Ability to identify changes in lifestyle to reduce recurrence of condition will improve Ability to verbalize feelings will improve Ability to disclose and discuss suicidal ideas Ability to demonstrate self-control will improve Ability to identify and develop effective coping behaviors will improve Ability to maintain clinical measurements within normal limits will improve Compliance with prescribed medications will improve Ability to identify changes in lifestyle to reduce recurrence of condition will improve Ability to verbalize feelings will improve Ability to disclose and discuss suicidal ideas Ability to demonstrate self-control will improve Ability to identify and develop effective coping behaviors will improve Ability to maintain clinical measurements within normal limits will improve Compliance with prescribed medications will improve  Medication  Management: Evaluate patient's response, side effects, and tolerance of medication regimen.  Therapeutic Interventions: 1 to 1 sessions, Unit Group sessions and Medication administration.  Evaluation of Outcomes: Not Met   RN Treatment Plan for Primary Diagnosis: Bipolar II disorder ([place]) Long Term Goal(s): Knowledge of disease and therapeutic regimen to maintain health will improve  Short Term Goals: Ability to remain free from injury will improve, Ability to verbalize frustration and anger appropriately will improve, Ability to demonstrate self-control, Ability to participate in decision making will improve, Ability to verbalize feelings will improve, Ability to disclose and discuss suicidal ideas, Ability to identify and develop effective coping behaviors will improve and Compliance with prescribed medications will improve  Medication Management: RN will administer medications as ordered by provider, will assess and evaluate patient's response and provide education to patient for prescribed medication. RN will report any adverse and/or side effects to prescribing provider.  Therapeutic Interventions: 1 on 1 counseling sessions, Psychoeducation, Medication administration, Evaluate responses to treatment, Monitor vital signs and CBGs as ordered, Perform/monitor CIWA, COWS, AIMS and Fall Risk screenings as ordered, Perform wound care treatments as ordered.  Evaluation of Outcomes: Not Met   LCSW Treatment Plan for Primary Diagnosis: Bipolar II disorder ([place]) Long Term Goal(s): Safe transition to appropriate next level of care at discharge, Engage patient in therapeutic group addressing interpersonal concerns.  Short Term Goals: Engage patient in aftercare planning with referrals and resources, Increase social support, Increase ability to appropriately verbalize feelings, Increase emotional regulation, Facilitate acceptance of mental health diagnosis and concerns, Facilitate patient progression  through stages of change regarding substance use diagnoses and concerns, Identify triggers associated with mental health/substance abuse issues and Increase skills for wellness and recovery  Therapeutic Interventions: Assess for all discharge needs, 1 to 1 time with Social worker, Explore available resources and support systems, Assess for adequacy in community support network, Educate family and significant other(s) on suicide prevention, Complete Psychosocial Assessment, Interpersonal group therapy.  Evaluation of Outcomes: Not Met   Progress in Treatment: Attending groups: Yes. Participating in groups: Yes. Taking medication as prescribed: Yes. Toleration medication: Yes. Family/Significant other contact made: No, will contact:  the patient's fiance, Shirlee More Patient understands diagnosis: Yes. Discussing patient identified problems/goals with staff: Yes. Medical problems stabilized or resolved: Yes. Denies suicidal/homicidal ideation: Yes. Issues/concerns per patient self-inventory: No. Other:   New problem(s) identified: None   New Short Term/Long Term Goal(s): medication stabilization, elimination of SI thoughts, development of comprehensive mental wellness   Patient Goal: "I just want to go home, be happy and healthier"  Discharge Plan or Barriers: Return home with her fiance, and follow up with [facility] for therapy services and [facility] for medication management.   Reason for Continuation of Hospitalization: Anxiety Depression Medication stabilization Suicidal ideation  Estimated Length of Stay: 06/16/17  Attendees: Patient: Rita Browning 06/10/2017 9:01 AM  Physician: Dr. Leanord Hawking 06/10/2017 9:01 AM  Nursing: Chrys Racer, RN  06/10/2017 9:01 AM  RN Care Manager: 06/10/2017 9:01 AM  Social Worker: Radonna Ricker, [place] 06/10/2017 9:01 AM  Recreational Therapist:  06/10/2017 9:01 AM  Other:  06/10/2017 9:01 AM  Other:  06/10/2017 9:01 AM  Other: 06/10/2017 9:01 AM     Scribe for Treatment Team: Marylee Floras, [place] 06/10/2017 9:01 AM

## 2017-06-11 MED ORDER — RISPERIDONE 1 MG PO TABS
1.0000 mg | ORAL_TABLET | Freq: Every day | ORAL | Status: DC
  Administered 2017-06-11: 1 mg via ORAL
  Filled 2017-06-11 (×2): qty 1

## 2017-06-11 NOTE — Progress Notes (Signed)
[facility] MD Progress Note  06/11/2017 12:39 PM Rita Browning  MRN:  [id number]   Subjective:  Patient states that she had a good night last nght and she slept well. She reports going to eat all of her meals. She denies any SI/HI/AVH and contracts for safety. She feels that she would better benefit from 1:1 support, but will continue the groups here, but plans to get a therapist once discharged for more 1:1 treatment. She denies any medication side effects.   Objective: Patient's chart and findings reviewed and discussed with treatment team. Patient presents in the milieu interacting with peers appropriately. She has been attending groups and going to the gym. She is pleasant and cooperative. Will increase the Risperidone to 1 mg QHS as originally planned. Patient should be ready for discharge in 1-2 days.  Principal Problem: Bipolar II disorder ([place]) Diagnosis:   Patient Active Problem List   Diagnosis Date Noted  . Bipolar II disorder ([place]) [F31.81] 06/09/2017   Total Time spent with patient: 25 minutes  Past Psychiatric History: See H&P  Past Medical History:  Past Medical History:  Diagnosis Date  . Anxiety   . Asthma   . Blood clot in vein right leg  . Hypertension   . PTSD (post-traumatic stress disorder)     Past Surgical History:  Procedure Laterality Date  . blood clot removed from right leg    . CHOLECYSTECTOMY    . ORIF ANKLE FRACTURE Right    Family History: History reviewed. No pertinent family history. Family Psychiatric  History: See H&P Social History:  Social History   Substance and Sexual Activity  Alcohol Use No  . Frequency: Never     Social History   Substance and Sexual Activity  Drug Use No    Social History   Socioeconomic History  . Marital status: Single    Spouse name: None  . Number of children: None  . Years of education: None  . Highest education level: None  Social Needs  . Financial resource strain: None  . Food insecurity - worry: None   . Food insecurity - inability: None  . Transportation needs - medical: None  . Transportation needs - non-medical: None  Occupational History  . None  Tobacco Use  . Smoking status: Current Every Day Smoker    Types: E-cigarettes  . Smokeless tobacco: Never Used  . Tobacco comment: vape  Substance and Sexual Activity  . Alcohol use: No    Frequency: Never  . Drug use: No  . Sexual activity: None  Other Topics Concern  . None  Social History Narrative  . None   Additional Social History:                         Sleep: Good  Appetite:  Good  Current Medications: Current Facility-Administered Medications  Medication Dose Route Frequency Provider Last Rate Last Dose  . acetaminophen (TYLENOL) tablet 650 mg  650 mg Oral Q6H PRN Laverle Hobby, PA-C      . alum & mag hydroxide-simeth (MAALOX/MYLANTA) 200-200-20 MG/5ML suspension 30 mL  30 mL Oral Q4H PRN Laverle Hobby, PA-C      . hydrOXYzine (ATARAX/VISTARIL) tablet 25 mg  25 mg Oral Q6H PRN Patriciaann Clan E, PA-C      . magnesium hydroxide (MILK OF MAGNESIA) suspension 30 mL  30 mL Oral Daily PRN Patriciaann Clan E, PA-C      . prazosin (MINIPRESS) capsule 2  mg  2 mg Oral QHS Izediuno, Vincent A, MD      . risperiDONE (RISPERDAL) tablet 0.5 mg  0.5 mg Oral QHS Nesta Kimple, Darnelle Maffucci B, FNP   0.5 mg at 06/10/17 2140  . traZODone (DESYREL) tablet 50 mg  50 mg Oral QHS,MR X 1 Laverle Hobby, PA-C   50 mg at 06/10/17 2140    Lab Results:  Results for orders placed or performed during the hospital encounter of 06/09/17 (from the past 48 hour(s))  TSH     Status: None   Collection Time: 06/10/17  6:19 PM  Result Value Ref Range   TSH 2.019 0.350 - 4.500 uIU/mL    Comment: Performed by a 3rd Generation assay with a functional sensitivity of <=0.01 uIU/mL. Performed at [facility], [place] [address], [place], [place] [address]     Blood Alcohol level:  Lab Results  Component Value Date   ETH <10  93/26/7124    Metabolic Disorder Labs: No results found for: HGBA1C, MPG No results found for: PROLACTIN No results found for: CHOL, TRIG, HDL, CHOLHDL, VLDL, LDLCALC  Physical Findings: AIMS:  , ,  ,  ,    CIWA:    COWS:     Musculoskeletal: Strength & Muscle Tone: within normal limits Gait & Station: normal Patient leans: N/A  Psychiatric Specialty Exam: Physical Exam  Nursing note and vitals reviewed. Constitutional: She is oriented to person, place, and time. She appears well-developed and well-nourished.  Cardiovascular: Normal rate.  Respiratory: Effort normal.  Musculoskeletal: Normal range of motion.  Neurological: She is alert and oriented to person, place, and time.  Skin: Skin is warm.    Review of Systems  Constitutional: Negative.   HENT: Negative.   Eyes: Negative.   Respiratory: Negative.   Cardiovascular: Negative.   Gastrointestinal: Negative.   Genitourinary: Negative.   Musculoskeletal: Negative.   Skin: Negative.   Neurological: Negative.   Endo/Heme/Allergies: Negative.   Psychiatric/Behavioral: Negative for depression, hallucinations and suicidal ideas. The patient is not nervous/anxious.     Blood pressure (!) 137/95, pulse (!) 123, temperature 98.1 F (36.7 C), temperature source Oral, resp. rate 18, height 5' 7.99" (1.727 m), weight (!) 150.1 kg (330 lb 14.6 oz), last menstrual period 06/08/2017, SpO2 100 %.Body mass index is 50.33 kg/m.  General Appearance: Casual  Eye Contact:  Good  Speech:  Clear and Coherent and Normal Rate  Volume:  Normal  Mood:  Euthymic  Affect:  Congruent  Thought Process:  Goal Directed and Descriptions of Associations: Intact  Orientation:  Full (Time, Place, and Person)  Thought Content:  WDL  Suicidal Thoughts:  No  Homicidal Thoughts:  No  Memory:  Immediate;   Good Recent;   Good Remote;   Good  Judgement:  Good  Insight:  Good  Psychomotor Activity:  Normal  Concentration:  Concentration: Good and  Attention Span: Good  Recall:  Good  Fund of Knowledge:  Good  Language:  Good  Akathisia:  No  Handed:  Right  AIMS (if indicated):     Assets:  Communication Skills Desire for Improvement Financial Resources/Insurance Housing Physical Health Social Support Transportation  ADL's:  Intact  Cognition:  WNL  Sleep:  Number of Hours: 6.75   Problems Addressed: Bipolar II  Treatment Plan Summary: Daily contact with patient to assess and evaluate symptoms and progress in treatment, Medication management and Plan is to:  -Increase Risperidone 1 mg PO QHS for mood stability -Continue Trazodone  50 mg PO QHS PRN for insomnia -Continue Prazosin 2 mg PO QHS for PTSD symptoms -Continue Vistaril 25 mg PO Q6H PRN for anxiety -Encourage group therapy participation  Lewis Shock, FNP 06/11/2017, 12:39 PM

## 2017-06-11 NOTE — Progress Notes (Signed)
D: Patient has been interacting well on the unit.  She expressed her frustration regarding group yesterday.  She stated, "I don't trust anyone.  I don't want to be here anymore.  I don't want to go to anymore groups."  Patient was upset over an incident with a staff member.  Patient discussed her feelings about it at length, and appeared to feel better about it.  She denies any thoughts of self harm.  She is attending groups and participating in her treatment.  A: Continue to monitor medication management and MD orders.  Safety checks continued every 15 minutes per protocol. Offer support and encouragement per protocol.    R: Patient is receptive to staff; her behavior is appropriate.

## 2017-06-11 NOTE — Progress Notes (Signed)
LCSW Group Therapy Note 06/11/2017 2:45 PM  Type of Therapy/Topic: Group Therapy: Balance in Life  Participation Level: Active  Description of Group:  This group will address the concept of balance and how it feels and looks when one is unbalanced. Patients will be encouraged to process areas in their lives that are out of balance and identify reasons for remaining unbalanced. Facilitators will guide patients in utilizing problem-solving interventions to address and correct the stressor making their life unbalanced. Understanding and applying boundaries will be explored and addressed for obtaining and maintaining a balanced life. Patients will be encouraged to explore ways to assertively make their unbalanced needs known to significant others in their lives, using other group members and facilitator for support and feedback.  Therapeutic Goals: 1. Patient will identify two or more emotions or situations they have that consume much of in their lives. 2. Patient will identify signs/triggers that life has become out of balance:  3. Patient will identify two ways to set boundaries in order to achieve balance in their lives:  4. Patient will demonstrate ability to communicate their needs through discussion and/or role plays  Summary of Patient Progress:  Rita Browning was engaged throughout the group session. She participated and contributed to the discussion. Rita Browning stated that she plans to work on being a little selfish, and make time for herself to maintain balance in her life.   Therapeutic Modalities:  Cognitive Behavioral Therapy Solution-Focused Therapy Assertiveness Training   Theresa Duty Clinical Social Worker

## 2017-06-11 NOTE — Progress Notes (Signed)
Pt attend wrap up group. Her day was a 7. Her goal to survive today

## 2017-06-11 NOTE — [facility] Group Notes (Signed)
Adult Psychoeducational Group Note  Date:  06/11/2017 Time:  10:55 AM  Group Topic/Focus:  Video  Participation Level:  Active  Participation Quality:  Appropriate  Affect:  Appropriate  Cognitive:  Appropriate  Insight: Appropriate  Engagement in Group:  Engaged  Modes of Intervention:  Activity  Additional Comments: Pt was alert and engaged in therapeutic television topic on vulnerability.  Huel Cote 06/11/2017, 10:55 AM

## 2017-06-11 NOTE — [facility] Group Notes (Signed)
Adult Psychoeducational Group Note  Date:  06/11/2017 Time:  9:02 AM  Group Topic/Focus:  Goals Group:   The focus of this group is to help patients establish daily goals to achieve during treatment and discuss how the patient can incorporate goal setting into their daily lives to aide in recovery.  Participation Level:  Active  Participation Quality:  Appropriate  Affect:  Appropriate  Cognitive:  Appropriate  Insight: Good  Engagement in Group:  Engaged  Modes of Intervention:  Orientation  Additional Comments: Pt participated in orientation/goals group. Pt goal for today is to survive and make it through the day.  Huel Cote 06/11/2017, 9:02 AM

## 2017-06-11 NOTE — Progress Notes (Signed)
D: Patient denies SI, HI or AVH. Patient has a pleasant mood with anxious affect.  She states that she had an unpleasant experience in one of her groups during the day but otherwise had a good day.  Expressed that she was able to walk away from the situation and use her coping skills.  Pt. 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  Pt. Is visualized on the unit interacting with staff and others.  A: Patient given emotional support from RN. Patient encouraged to come to staff with concerns and/or questions. Patient's medication routine continued. Patient's orders and plan of care reviewed.   R: Patient remains appropriate and cooperative. Will continue to monitor patient q15 minutes for safety.

## 2017-06-12 MED ORDER — RISPERIDONE 1 MG PO TABS: 1.0000 mg | ORAL_TABLET | Freq: Every day | ORAL | 0 refills | Status: DC

## 2017-06-12 MED ORDER — HYDROXYZINE HCL 25 MG PO TABS: 25.0000 mg | ORAL_TABLET | Freq: Four times a day (QID) | ORAL | 0 refills | Status: DC | PRN

## 2017-06-12 MED ORDER — PRAZOSIN HCL 2 MG PO CAPS: 2.0000 mg | ORAL_CAPSULE | Freq: Every day | ORAL | 0 refills | Status: AC

## 2017-06-12 MED ORDER — TRAZODONE HCL 50 MG PO TABS: 50.0000 mg | ORAL_TABLET | Freq: Every evening | ORAL | 0 refills | Status: DC | PRN

## 2017-06-12 MED ORDER — TRAZODONE HCL 50 MG PO TABS
50.0000 mg | ORAL_TABLET | Freq: Every evening | ORAL | Status: DC | PRN
  Filled 2017-06-12: qty 7

## 2017-06-12 NOTE — Discharge Summary (Signed)
Physician Discharge Summary Note  Patient:  Rita Browning is an 37 y.o., female MRN:  [id number] DOB:  [birth year] Patient phone:  [phone] (home)  Patient address:   [address] [place] [address],  Total Time spent with patient: 20 minutes  Date of Admission:  06/09/2017 Date of Discharge: 06/12/17  Reason for Admission:  Worsening depression with SI  Principal Problem: Bipolar II disorder [facility]) Discharge Diagnoses: Patient Active Problem List   Diagnosis Date Noted  . Bipolar II disorder ([place]) [F31.81] 06/09/2017    Past Psychiatric History: Long history of mood instability. She has seen multiple mental health providers over the years. Says she had not stayed in treatment very long. She has been tried on multiple medications. Remembers being on Paxil, Topiramate. Says she did well on Abilify but retained a lot of fluid on it. She did well on Cariprazine but could not afford it after she lost her insurance. This is her first inpatient care. Patient started cutting when she 37 years of age. Says her partner then taught her how to cut. She used to cut self in her thighs. No curtting for the past three years.   Past Medical History:  Past Medical History:  Diagnosis Date  . Anxiety   . Asthma   . Blood clot in vein right leg  . Hypertension   . PTSD (post-traumatic stress disorder)     Past Surgical History:  Procedure Laterality Date  . blood clot removed from right leg    . CHOLECYSTECTOMY    . ORIF ANKLE FRACTURE Right    Family History: History reviewed. No pertinent family history. Family Psychiatric  History: Mother has Bipolar Disorder  Social History:  Social History   Substance and Sexual Activity  Alcohol Use No  . Frequency: Never     Social History   Substance and Sexual Activity  Drug Use No    Social History   Socioeconomic History  . Marital status: Single    Spouse name: None  . Number of children: None  . Years of education: None  .  Highest education level: None  Social Needs  . Financial resource strain: None  . Food insecurity - worry: None  . Food insecurity - inability: None  . Transportation needs - medical: None  . Transportation needs - non-medical: None  Occupational History  . None  Tobacco Use  . Smoking status: Current Every Day Smoker    Types: E-cigarettes  . Smokeless tobacco: Never Used  . Tobacco comment: vape  Substance and Sexual Activity  . Alcohol use: No    Frequency: Never  . Drug use: No  . Sexual activity: None  Other Topics Concern  . None  Social History Narrative  . None    Hospital Course:   06/09/17 [facility] MD Assessment: 37 y.o Caucasian female, unemployed, lives with her partner. Background history of  Bipolar Disorder, early life trauma and self mutilation. Presented to the ER voluntarily in company of her partner. Expressed worsening depression in the past couple of days. Has been having more severe suicidal thoughts. Has thought about shooting herself, taking and OD and cutting herself. Family has secured all the weapons in the house. Main stressor is not being able to hold a job. Routine labs significant for mildly elevated AST and ALT, thrombocytosis. Toxicology is negative. UDS is is negative. BAL <10 mg/dl. At interview, patient reports long history of mood swings. Says she is okay one minute and angry the next minute.  Says the longest she has held a job is a year. She is laid off most of the time because she has called in sick a lot or has acted impulsively at her place of work. Patient says she has been more depressed lately. She has not been able to get another job since November. Says she is tired of the same cycle over and over. She has been preoccupied with ending her life. Says she sometimes have nightmares about dying. Feels disappointed when she wakes up and find out it was just a dream. Says her quickness to anger and irritability has been affecting her relationship. She has  impulsively said mean things to her significant other lately. Says her sleep cycle has been up and down. She has been eating a lot more lately. She has gained extra weight. No associated paranoia. No associated grandiose delusions. No somatic or nihilistic delusions. Ho hallucination in nay modality. No homicidal thoughts. No thoughts of violence. No substance use.  Says she wants to get better. She is not sure how she is going to pay for her care but just wants to feel better. No legal issues. No relational difficulties. They plan to get married in October. No other stressors other than financial constraints  Patient remained on the [facility] unit for 3 days and stabilized with medication and therapy. Patient was started on Risperidone and titrated to 1 mg QHS, Prazosin 2 mg QHS, Vistaril 25 mg Q6H PRN, and Trazodone 50 mg QHS PRN. Patient showed improvement with improved mood, affect, sleep, appetite, and interaction. Patient was seen in the day room interacting appropriately with peers and staff. Patient was attending groups and participating. Collateral was gained by the CSW from the patient's girlfriend for safety for patient to be discharged. She denies any SI/HI/AVH and contracts for safety. Patient is provided with prescriptions for her medications upon discharge.   Physical Findings: AIMS:  , ,  ,  ,    CIWA:    COWS:     Musculoskeletal: Strength & Muscle Tone: within normal limits Gait & Station: normal Patient leans: N/A  Psychiatric Specialty Exam: Physical Exam  Nursing note and vitals reviewed. Constitutional: She appears well-developed and well-nourished.  Respiratory: Effort normal.  Musculoskeletal: Normal range of motion.  Neurological: She is alert.  Skin: Skin is warm.    Review of Systems  HENT: Negative.   Eyes: Negative.   Respiratory: Negative.   Cardiovascular: Negative.   Gastrointestinal: Negative.   Genitourinary: Negative.   Musculoskeletal: Negative.   Skin:  Negative.   Neurological: Negative.   Endo/Heme/Allergies: Negative.   Psychiatric/Behavioral: Negative.     Blood pressure 116/83, pulse (!) 110, temperature 97.6 F (36.4 C), temperature source Oral, resp. rate 16, height 5' 7.99" (1.727 m), weight (!) 150.1 kg (330 lb 14.6 oz), last menstrual period 06/08/2017, SpO2 100 %.Body mass index is 50.33 kg/m.  General Appearance: Casual  Eye Contact:  Good  Speech:  Clear and Coherent and Normal Rate  Volume:  Normal  Mood:  Euthymic  Affect:  Congruent  Thought Process:  Goal Directed and Descriptions of Associations: Intact  Orientation:  Full (Time, Place, and Person)  Thought Content:  WDL  Suicidal Thoughts:  No  Homicidal Thoughts:  No  Memory:  Immediate;   Good Recent;   Good Remote;   Good  Judgement:  Good  Insight:  Good  Psychomotor Activity:  Normal  Concentration:  Concentration: Good and Attention Span: Good  Recall:  Good  Fund of Knowledge:  Good  Language:  Good  Akathisia:  No  Handed:  Right  AIMS (if indicated):     Assets:  Communication Skills Desire for Improvement Financial Resources/Insurance Housing Physical Health Social Support Transportation  ADL's:  Intact  Cognition:  WNL  Sleep:  Number of Hours: 6.75        Has this patient used any form of tobacco in the last 30 days? (Cigarettes, Smokeless Tobacco, Cigars, and/or Pipes) Yes, No  Blood Alcohol level:  Lab Results  Component Value Date   ETH <10 83/66/2947    Metabolic Disorder Labs:  No results found for: HGBA1C, MPG No results found for: PROLACTIN No results found for: CHOL, TRIG, HDL, CHOLHDL, VLDL, LDLCALC  See Psychiatric Specialty Exam and Suicide Risk Assessment completed by Attending Physician prior to discharge.  Discharge destination:  Home  Is patient on multiple antipsychotic therapies at discharge:  No   Has Patient had three or more failed trials of antipsychotic monotherapy by history:  No  Recommended Plan  for Multiple Antipsychotic Therapies: NA   Allergies as of 06/12/2017      Reactions   Codeine Hives, Nausea Only      Medication List    TAKE these medications     Indication  hydrOXYzine 25 MG tablet Commonly known as:  ATARAX/VISTARIL Take 1 tablet (25 mg total) by mouth every 6 (six) hours as needed for anxiety.  Indication:  Feeling Anxious   prazosin 2 MG capsule Commonly known as:  MINIPRESS Take 1 capsule (2 mg total) by mouth at bedtime. For nightmares  Indication:  PTSD symptoms   risperiDONE 1 MG tablet Commonly known as:  RISPERDAL Take 1 tablet (1 mg total) by mouth at bedtime. For mood control  Indication:  mood stability   traZODone 50 MG tablet Commonly known as:  DESYREL Take 1 tablet (50 mg total) by mouth at bedtime as needed for sleep.  Indication:  Trouble Sleeping      Follow-up Information    [facility] Follow up.   Why:  Appointment is 06/17/17 8:00am. Please bring your Picture ID, SSN, Proof of Income (if any), Copy of insurance (if any). If you do not have proff of income or insurance, that is fine. Please be on time.  Contact information: [address] [place] [address] [phone]        [facility] Follow up.   Why:  Appointment is 06/22/17 at 1:00pm Contact information: [place] [address] [phone]           Follow-up recommendations:  Continue activity as tolerated. Continue diet as recommended by your PCP. Ensure to keep all appointments with outpatient providers.  Comments:  Patient is instructed prior to discharge to: Take all medications as prescribed by his/her mental healthcare provider. Report any adverse effects and or reactions from the medicines to his/her outpatient provider promptly. Patient has been instructed & cautioned: To not engage in alcohol and or illegal drug use while on prescription medicines. In the event of worsening symptoms, patient is instructed to call the  crisis hotline, 911 and or go to the nearest ED for appropriate evaluation and treatment of symptoms. To follow-up with his/her primary care provider for your other medical issues, concerns and or health care needs.    Signed: [place], FNP 06/12/2017, 9:56 AM

## 2017-06-12 NOTE — Progress Notes (Addendum)
Rita Browning is prepared for DC as she completes her daily assessment this morning  and on this she writes she denied SI  Today and she rates her depression, hopelessness and anxeity " 1/1/1/", respectively. " She says ..." I gotta do this " and she tells Probation officer that she is not returning to work, when she is discharged from [facility]. She is given cc of all dc instructions ( SSP, SRA, AVS and transition record and she is escorted to entrance of bldg entrance.1. Safety is in [place], All belongings are returned to her also.

## 2017-06-12 NOTE — BHH Group Notes (Signed)
Orientation / Goals   Date:  06/12/2017  Time:  9:48 AM  Type of Therapy:  Nurse Education the group focuses on teaching patients who their staff is, what the staff responsibilities are and what unit programming looks like. SMART goal setting is discussed as well.   Participation Level:  Active  Participation Quality:  Attentive  Affect:  Appropriate  Cognitive:  Alert  Insight:  Appropriate  Engagement in Group:  Engaged  Modes of Intervention:  Exploration  Summary of Progress/Problems:  Lauralyn Primes 06/12/2017, 9:48 AM

## 2017-06-12 NOTE — Progress Notes (Signed)
  [facility] Adult Case Management Discharge Plan :  Will you be returning to the same living situation after discharge:  Yes,  home At discharge, do you have transportation home?: Yes,  husband Do you have the ability to pay for your medications: Yes,  mental health  Release of information consent forms completed and submitted to medical records by CSW.  Patient to Follow up at: Follow-up Information    [facility] Follow up.   Why:  Appointment is 06/17/17 8:00am. Please bring your Picture ID, SSN, Proof of Income (if any), Copy of insurance (if any). If you do not have proff of income or insurance, that is fine. Please be on time.  Contact information: [address] [place] [address] [phone]        [facility] Follow up.   Why:  Appointment is 06/22/17 at 1:00pm Contact information: [place] [address] [phone]           Next level of care provider has access to [place] and Suicide Prevention discussed: Yes,  SPE completed with pt's husband. SPI pamphlet and Mobile Crisis information provided to pt.   Has patient been referred to the Quitline?: N/A patient is not a smoker  Patient has been referred for addiction treatment: N/A  [employer], LCSW 06/12/2017, 11:09 AM

## 2017-06-12 NOTE — Progress Notes (Signed)
Recreation Therapy Notes  Date: 06/12/17 Time: 0930 Location: [address] Dayroom  Group Topic: Stress Management  Goal Area(s) Addresses:  Patient will verbalize importance of using healthy stress management.  Patient will identify positive emotions associated with healthy stress management.   Behavioral Response: Engaged  Intervention: Stress Management  Activity :  Body Scan Meditation.  LRT introduced the stress management technique of meditation.  LRT played a meditation from the Calm app that allowed patients to take inventory of any sensations or tension they may have been experiencing.  Education:  Stress Management, Discharge Planning.   Education Outcome: Acknowledges edcuation/In group clarification offered/Needs additional education  Clinical Observations/Feedback: Pt attended group.    Victorino Sparrow, LRT/CTRS         Victorino Sparrow A 06/12/2017 12:47 PM

## 2017-06-12 NOTE — BHH Suicide Risk Assessment (Signed)
[facility] Discharge Suicide Risk Assessment   Principal Problem: Bipolar II disorder [facility]) Discharge Diagnoses:  Patient Active Problem List   Diagnosis Date Noted  . Bipolar II disorder ([place]) [F31.81] 06/09/2017    Total Time spent with patient: 45 minutes  Musculoskeletal: Strength & Muscle Tone: within normal limits Gait & Station: normal Patient leans: N/A  Psychiatric Specialty Exam: Review of Systems  Constitutional: Negative.   HENT: Negative.   Eyes: Negative.   Respiratory: Negative.   Cardiovascular: Negative.   Gastrointestinal: Negative.   Genitourinary: Negative.   Musculoskeletal: Negative.   Skin: Negative.   Psychiatric/Behavioral: Negative for depression, hallucinations, memory loss, substance abuse and suicidal ideas. The patient is not nervous/anxious and does not have insomnia.     Blood pressure 116/83, pulse (!) 110, temperature 97.6 F (36.4 C), temperature source Oral, resp. rate 16, height 5' 7.99" (1.727 m), weight (!) 150.1 kg (330 lb 14.6 oz), last menstrual period 06/08/2017, SpO2 100 %.Body mass index is 50.33 kg/m.  General Appearance: Neatly dressed, pleasant, engaging well and cooperative. Appropriate behavior. Not in any distress. Good relatedness. Not internally stimulated.  Eye Contact::  Good  Speech: Spontaneous, normal prosody. Normal tone and rate.   Volume:  Normal  Mood:  Euthymic  Affect:  Appropriate and Full Range  Thought Process:  Linear  Orientation:  Full (Time, Place, and Person)  Thought Content:  No delusional theme. No preoccupation with violent thoughts. No negative ruminations. No obsession.  No hallucination in any modality.   Suicidal Thoughts:  No  Homicidal Thoughts:  No  Memory:  Immediate;   Good Recent;   Good Remote;   Good  Judgement:  Good  Insight:  Good  Psychomotor Activity:  Normal  Concentration:  Good  Recall:  Good  Fund of Knowledge:Good  Language: Good  Akathisia:  Negative  Handed:    AIMS (if  indicated):     Assets:  Communication Skills Desire for Improvement Housing Intimacy Physical Health Resilience  Sleep:  Number of Hours: 6.75  Cognition: WNL  ADL's:  Intact   Clinical  Assessment::   37 y.o Caucasian female, unemployed, lives with her partner. Background history of  Bipolar Disorder, early life trauma and self mutilation. Presented to the ER voluntarily in company of her partner. Expressed worsening depression in the past couple of days. Has been having more severe suicidal thoughts. Has thought about shooting herself, taking and OD and cutting herself. Family has secured all the weapons in the house. Main stressor is not being able to hold a job. Routine labs significant for mildly elevated AST and ALT, thrombocytosis. Toxicology is negative. UDS is is negative. BAL <10 mg/dl.  Seen today. Says she is feeling really calm on her current medication. She is no longer having mood swings. She is able to tolerate frustrations more. Says she is no longer quick to anger. She has not had any hopelessness or worthlessness since she has been here. Reports that she is really in good spirits. She is not feeling depressed. Reports she has been eating normally. Her energy levels are back to normal. She is able to think clearly. She is able to focus on task. Her thoughts are not crowded or racing. No evidence of mania. No hallucination in any modality. She is not making any delusional statement. No passivity of will/thought. She is fully in touch with reality. No thoughts of suicide. No thoughts of homicide. No violent thoughts. No overwhelming anxiety. No evidence of dissociation. No access  to weapons.  Denies any new stressors at home.    Nursing staff reports that patient has been appropriate on the unit. Patient has been interacting well with peers. No behavioral issues. Patient has not voiced any suicidal thoughts. Patient has not been observed to be internally stimulated. Patient has been  adherent with treatment recommendations. Patient has been tolerating their medication well.   Patient was discussed at team. Team members feels that patient is back to her baseline level of function. Team agrees with plan to discharge patient today.   Demographic Factors:  Gay, lesbian, or bisexual orientation, Low socioeconomic status and Unemployed  Loss Factors: Decrease in vocational status and Financial problems/change in socioeconomic status  Historical Factors: Impulsivity  Risk Reduction Factors:   Living with another person, especially a relative, Positive social support, Positive therapeutic relationship and Positive coping skills or problem solving skills  Continued Clinical Symptoms:  As above   Cognitive Features That Contribute To Risk:  None    Suicide Risk:  Minimal: No identifiable suicidal ideation.  Patient is not having any thoughts of suicide at this time. Modifiable risk factors targeted during this admission includes mood instability. Demographical and historical risk factors cannot be modified. Patient is now engaging well. Patient is reliable and is future oriented. We have buffered patient's support structures. At this point, patient is at low risk of suicide. Patient is aware of the effects of psychoactive substances on decision making process. Patient has been provided with emergency contacts. Patient acknowledges to use resources provided if unforseen circumstances changes their current risk stratification.    Follow-up Information    [facility] Follow up.   Why:  Appointment is 06/17/17 8:00am. Please bring your Picture ID, SSN, Proof of Income (if any), Copy of insurance (if any). If you do not have proff of income or insurance, that is fine. Please be on time.  Contact information: [address] [place] [address] [phone]        [facility] Follow up.   Why:  Appointment is 06/22/17 at 1:00pm Contact information: [address] [place] [address] [phone]           Plan Of Care/Follow-up recommendations:  1. Continue current psychotropic medications 2. Mental health and addiction follow up as arranged.  3. Discharge in care of her girlfriend.  4. Provided limited quantity of prescriptions    [place], MD 06/12/2017, 10:12 AM

## 2017-06-12 NOTE — Progress Notes (Signed)
D: Pt denies SI/HI/AV hallucinations. Pt is pleasant and cooperative. Pt goal for today was to laugh and make others laugh. A: Pt was offered support and encouragement. Pt was given scheduled medications. Pt was encourage to attend groups. Q 15 minute checks were done for safety.  R:Pt attends groups and interacts well with peers and staff. Pt is taking medication. Pt has no complaints.Pt receptive to treatment and safety maintained on unit.

## 2017-08-21 NOTE — Congregational Nurse Program (Signed)
Congregational Nurse Program Note  Date of Encounter: 08/21/2017  Past Medical History: Past Medical History:  Diagnosis Date  . Anxiety   . Asthma   . Blood clot in vein right leg  . Hypertension   . PTSD (post-traumatic stress disorder)     Encounter Details: CNP Questionnaire - 08/19/17 0945      Questionnaire   Patient Status  Not Applicable    Race  White or Caucasian    Insurance  Not Applicable    Uninsured  Uninsured (NEW 1x/quarter)    Food  No food insecurities    Housing/Utilities  Yes, have permanent housing    Transportation  No transportation needs    Interpersonal Safety  Yes, feel physically and emotionally safe where you currently live    Medication  No medication insecurities    Medical Provider  No    ED Visit Averted  Not Applicable    Life-Saving Intervention Made  Not Applicable     Seen at the [facility]. No insurance; information given for [facility]. Stated will go for assessment on Monday, April 29. B/P 139/94; [address] Erma Heritage RN, [facility], [phone]

## 2017-10-15 ENCOUNTER — Encounter: Payer: Self-pay | Admitting: Physician Assistant

## 2017-10-15 ENCOUNTER — Ambulatory Visit: Payer: Self-pay | Admitting: Physician Assistant

## 2017-10-15 VITALS — BP 143/89 | HR 105 | Temp 98.1°F | Ht 67.75 in | Wt 334.5 lb

## 2017-10-15 DIAGNOSIS — I10 Essential (primary) hypertension: Secondary | ICD-10-CM

## 2017-10-15 DIAGNOSIS — Z862 Personal history of diseases of the blood and blood-forming organs and certain disorders involving the immune mechanism: Secondary | ICD-10-CM

## 2017-10-15 DIAGNOSIS — Z7689 Persons encountering health services in other specified circumstances: Secondary | ICD-10-CM

## 2017-10-15 DIAGNOSIS — R7989 Other specified abnormal findings of blood chemistry: Secondary | ICD-10-CM

## 2017-10-15 DIAGNOSIS — R945 Abnormal results of liver function studies: Secondary | ICD-10-CM

## 2017-10-15 DIAGNOSIS — Z131 Encounter for screening for diabetes mellitus: Secondary | ICD-10-CM

## 2017-10-15 DIAGNOSIS — Z8639 Personal history of other endocrine, nutritional and metabolic disease: Secondary | ICD-10-CM

## 2017-10-15 MED ORDER — HYDROCHLOROTHIAZIDE 12.5 MG PO CAPS: 12.5000 mg | ORAL_CAPSULE | Freq: Every day | ORAL | 1 refills | Status: DC

## 2017-10-15 NOTE — Patient Instructions (Addendum)
For [facility]: -4715 tax return for you and spouse for year 2018 -spouse's one month of consecutive 77

## 2017-10-15 NOTE — Progress Notes (Signed)
BP (!) 143/89 (BP Location: Left Arm, Patient Position: Sitting, Cuff Size: Large)   Pulse (!) 105   Temp 98.1 F (36.7 C)   Ht 5' 7.75" (1.721 m)   Wt (!) 334 lb 8 oz (151.7 kg)   SpO2 97%   BMI 51.24 kg/m    Subjective:    Patient ID: Rita Browning, female    DOB: 1980/12/08, 37 y.o.   MRN: [id number]  HPI: Rita Browning is a 37 y.o. female presenting on 10/15/2017 for New Patient (Initial Visit)   HPI   Chief Complaint  Patient presents with  . New Patient (Initial Visit)    pt moved from [place] one year go and has not had PCP since then.  . Mental Health Problem    current patient with [facility].     Inpatient earlier this year for bipolar disorder.  Long history mental illness including cutting.  Pt is currently going to [facility] for care.   History htn and chol  Pt hx clot in leg was due to being hit by softball that came out of a pitching machine many years ago  Last PAP was maybe 4-6 years ago.    Relevant past medical, surgical, family and social history reviewed and updated as indicated. Interim medical history since our last visit reviewed. Allergies and medications reviewed and updated.   Current Outpatient Medications:  .  busPIRone (BUSPAR) 15 MG tablet, Take 15 mg by mouth 3 (three) times daily., Disp: , Rfl:  .  clonazePAM (KLONOPIN) 1 MG tablet, Take 0.5 mg by mouth as needed for anxiety., Disp: , Rfl:  .  hydrOXYzine (ATARAX/VISTARIL) 50 MG tablet, Take 50 mg by mouth every 4 (four) hours as needed., Disp: , Rfl:  .  OXcarbazepine (TRILEPTAL) 150 MG tablet, Take 150 mg by mouth 3 (three) times daily., Disp: , Rfl:  .  prazosin (MINIPRESS) 2 MG capsule, Take 1 capsule (2 mg total) by mouth at bedtime. For nightmares (Patient taking differently: Take 2 mg by mouth 2 (two) times daily. For nightmares), Disp: 30 capsule, Rfl: 0 .  risperiDONE (RISPERDAL) 2 MG tablet, Take 2 mg by mouth at bedtime., Disp: , Rfl:  .  traZODone (DESYREL) 150 MG tablet, Take 300  mg by mouth at bedtime., Disp: , Rfl:    Review of Systems  Constitutional: Positive for fatigue. Negative for appetite change, chills, diaphoresis, fever and unexpected weight change.  HENT: Positive for dental problem. Negative for congestion, drooling, ear pain, facial swelling, hearing loss, mouth sores, sneezing, sore throat, trouble swallowing and voice change.   Eyes: Negative for pain, discharge, redness, itching and visual disturbance.  Respiratory: Negative for cough, choking, shortness of breath and wheezing.   Cardiovascular: Positive for leg swelling. Negative for chest pain and palpitations.  Gastrointestinal: Negative for abdominal pain, blood in stool, constipation, diarrhea and vomiting.  Endocrine: Positive for polydipsia. Negative for cold intolerance and heat intolerance.  Genitourinary: Negative for decreased urine volume, dysuria and hematuria.  Musculoskeletal: Positive for back pain. Negative for arthralgias and gait problem.  Skin: Negative for rash.  Allergic/Immunologic: Negative for environmental allergies.  Neurological: Positive for headaches. Negative for seizures, syncope and light-headedness.  Hematological: Negative for adenopathy.  Psychiatric/Behavioral: Positive for dysphoric mood and suicidal ideas. Negative for agitation. The patient is nervous/anxious.     Per HPI unless specifically indicated above     Objective:    BP (!) 143/89 (BP Location: Left Arm, Patient Position: Sitting, Cuff Size:  Large)   Pulse (!) 105   Temp 98.1 F (36.7 C)   Ht 5' 7.75" (1.721 m)   Wt (!) 334 lb 8 oz (151.7 kg)   SpO2 97%   BMI 51.24 kg/m   Wt Readings from Last 3 Encounters:  10/15/17 (!) 334 lb 8 oz (151.7 kg)  06/09/17 (!) 330 lb 14.6 oz (150.1 kg)  06/08/17 (!) 331 lb (150.1 kg)    Physical Exam  Constitutional: She is oriented to person, place, and time. She appears well-developed and well-nourished.  HENT:  Head: Normocephalic and atraumatic.   Mouth/Throat: Oropharynx is clear and moist. No oropharyngeal exudate.  Eyes: Pupils are equal, round, and reactive to light. Conjunctivae and EOM are normal.  Neck: Neck supple. No thyromegaly present.  Cardiovascular: Normal rate and regular rhythm.  Pulmonary/Chest: Effort normal and breath sounds normal.  Abdominal: Soft. Bowel sounds are normal. She exhibits no mass. There is no hepatosplenomegaly. There is no tenderness.  Musculoskeletal: She exhibits edema (trace BLE).  Lymphadenopathy:    She has no cervical adenopathy.  Neurological: She is alert and oriented to person, place, and time. Gait normal.  Skin: Skin is warm and dry.  Psychiatric: She has a normal mood and affect. Her behavior is normal.  Vitals reviewed.       Assessment & Plan:   Encounter Diagnoses  Name Primary?  . Encounter to establish care Yes  . Elevated LFTs   . History of thrombocytosis   . History of hyperlipidemia   . Screening for diabetes mellitus   . Essential hypertension   . Morbid obesity ([place])     -pt was sigened up for [employer] medication assistance program -rx hctz 12.5 qd for blood pressure -Get baseline labs -Pt to [facility] for [place] issues -Pt to follow up 1 month.  RTO sooner prn

## 2017-10-19 ENCOUNTER — Other Ambulatory Visit (HOSPITAL_COMMUNITY)
Admission: RE | Admit: 2017-10-19 | Discharge: 2017-10-19 | Disposition: A | Discharge status: Home / Self Care | Payer: Self-pay | Source: Ambulatory Visit | Attending: Physician Assistant | Admitting: Physician Assistant

## 2017-10-19 DIAGNOSIS — Z862 Personal history of diseases of the blood and blood-forming organs and certain disorders involving the immune mechanism: Secondary | ICD-10-CM | POA: Insufficient documentation

## 2017-10-19 DIAGNOSIS — I10 Essential (primary) hypertension: Secondary | ICD-10-CM | POA: Insufficient documentation

## 2017-10-19 DIAGNOSIS — R945 Abnormal results of liver function studies: Secondary | ICD-10-CM | POA: Insufficient documentation

## 2017-10-19 DIAGNOSIS — Z8639 Personal history of other endocrine, nutritional and metabolic disease: Secondary | ICD-10-CM | POA: Insufficient documentation

## 2017-10-19 DIAGNOSIS — R7989 Other specified abnormal findings of blood chemistry: Secondary | ICD-10-CM

## 2017-10-19 DIAGNOSIS — Z131 Encounter for screening for diabetes mellitus: Secondary | ICD-10-CM | POA: Insufficient documentation

## 2017-10-19 LAB — COMPREHENSIVE METABOLIC PANEL
ALK PHOS: 50 U/L (ref 38–126)
ALT: 74 U/L — ABNORMAL HIGH (ref 14–54)
AST: 52 U/L — ABNORMAL HIGH (ref 15–41)
Albumin: 3.6 g/dL (ref 3.5–5.0)
Anion gap: 8 (ref 5–15)
BUN: 9 mg/dL (ref 6–20)
CALCIUM: 8.8 mg/dL — AB (ref 8.9–10.3)
CHLORIDE: 106 mmol/L (ref 101–111)
CO2: 25 mmol/L (ref 22–32)
Creatinine, Ser: 0.79 mg/dL (ref 0.44–1.00)
Glucose, Bld: 101 mg/dL — ABNORMAL HIGH (ref 65–99)
Potassium: 4 mmol/L (ref 3.5–5.1)
Sodium: 139 mmol/L (ref 135–145)
TOTAL PROTEIN: 6.9 g/dL (ref 6.5–8.1)
Total Bilirubin: 0.8 mg/dL (ref 0.3–1.2)

## 2017-10-19 LAB — CBC
HEMATOCRIT: 36.3 % (ref 36.0–46.0)
HEMOGLOBIN: 11.9 g/dL — AB (ref 12.0–15.0)
MCH: 29 pg (ref 26.0–34.0)
MCHC: 32.8 g/dL (ref 30.0–36.0)
MCV: 88.3 fL (ref 78.0–100.0)
PLATELETS: 257 10*3/uL (ref 150–400)
RBC: 4.11 MIL/uL (ref 3.87–5.11)
RDW: 13.9 % (ref 11.5–15.5)
WBC: 6.8 10*3/uL (ref 4.0–10.5)

## 2017-10-19 LAB — HEMOGLOBIN A1C
Hgb A1c MFr Bld: 5.6 % (ref 4.8–5.6)
Mean Plasma Glucose: 114.02 mg/dL

## 2017-10-19 LAB — LIPID PANEL
CHOLESTEROL: 221 mg/dL — AB (ref 0–200)
HDL: 26 mg/dL — ABNORMAL LOW (ref >40)
LDL Cholesterol: 149 mg/dL — ABNORMAL HIGH (ref 0–99)
TRIGLYCERIDES: 232 mg/dL — AB (ref <150)
Total CHOL/HDL Ratio: 8.5 RATIO
VLDL: 46 mg/dL — ABNORMAL HIGH (ref 0–40)

## 2017-10-22 ENCOUNTER — Inpatient Hospital Stay (HOSPITAL_COMMUNITY)
Admission: RE | Admit: 2017-10-22 | Discharge: 2017-10-25 | DRG: 885 | Disposition: A | Discharge status: Home / Self Care | Payer: Federal, State, Local not specified - Other | Attending: Psychiatry | Admitting: Psychiatry

## 2017-10-22 ENCOUNTER — Other Ambulatory Visit: Payer: Self-pay

## 2017-10-22 ENCOUNTER — Encounter (HOSPITAL_COMMUNITY): Payer: Self-pay | Admitting: *Deleted

## 2017-10-22 DIAGNOSIS — F4310 Post-traumatic stress disorder, unspecified: Secondary | ICD-10-CM | POA: Diagnosis present

## 2017-10-22 DIAGNOSIS — Z79899 Other long term (current) drug therapy: Secondary | ICD-10-CM

## 2017-10-22 DIAGNOSIS — E785 Hyperlipidemia, unspecified: Secondary | ICD-10-CM | POA: Diagnosis present

## 2017-10-22 DIAGNOSIS — F17210 Nicotine dependence, cigarettes, uncomplicated: Secondary | ICD-10-CM | POA: Diagnosis not present

## 2017-10-22 DIAGNOSIS — Z885 Allergy status to narcotic agent status: Secondary | ICD-10-CM

## 2017-10-22 DIAGNOSIS — F603 Borderline personality disorder: Secondary | ICD-10-CM | POA: Diagnosis present

## 2017-10-22 DIAGNOSIS — I10 Essential (primary) hypertension: Secondary | ICD-10-CM | POA: Diagnosis present

## 2017-10-22 DIAGNOSIS — F17290 Nicotine dependence, other tobacco product, uncomplicated: Secondary | ICD-10-CM | POA: Diagnosis present

## 2017-10-22 DIAGNOSIS — F3181 Bipolar II disorder: Secondary | ICD-10-CM | POA: Diagnosis not present

## 2017-10-22 DIAGNOSIS — F332 Major depressive disorder, recurrent severe without psychotic features: Secondary | ICD-10-CM | POA: Diagnosis present

## 2017-10-22 DIAGNOSIS — Z818 Family history of other mental and behavioral disorders: Secondary | ICD-10-CM

## 2017-10-22 DIAGNOSIS — F419 Anxiety disorder, unspecified: Secondary | ICD-10-CM | POA: Diagnosis present

## 2017-10-22 DIAGNOSIS — R45851 Suicidal ideations: Secondary | ICD-10-CM | POA: Diagnosis present

## 2017-10-22 DIAGNOSIS — Z915 Personal history of self-harm: Secondary | ICD-10-CM

## 2017-10-22 MED ORDER — TRAZODONE HCL 150 MG PO TABS
300.0000 mg | ORAL_TABLET | Freq: Every day | ORAL | Status: DC
  Filled 2017-10-22 (×2): qty 2

## 2017-10-22 MED ORDER — PRAZOSIN HCL 2 MG PO CAPS
2.0000 mg | ORAL_CAPSULE | Freq: Two times a day (BID) | ORAL | Status: DC
  Administered 2017-10-22 – 2017-10-23 (×2): 2 mg via ORAL
  Filled 2017-10-22: qty 2
  Filled 2017-10-22 (×3): qty 1
  Filled 2017-10-22: qty 2
  Filled 2017-10-22: qty 1

## 2017-10-22 MED ORDER — BUSPIRONE HCL 15 MG PO TABS
15.0000 mg | ORAL_TABLET | Freq: Three times a day (TID) | ORAL | Status: DC
  Administered 2017-10-22 – 2017-10-25 (×9): 15 mg via ORAL
  Filled 2017-10-22 (×16): qty 1

## 2017-10-22 MED ORDER — HYDROCHLOROTHIAZIDE 12.5 MG PO CAPS
12.5000 mg | ORAL_CAPSULE | Freq: Every day | ORAL | Status: DC
  Administered 2017-10-24 – 2017-10-25 (×2): 12.5 mg via ORAL
  Filled 2017-10-22 (×7): qty 1

## 2017-10-22 MED ORDER — NICOTINE POLACRILEX 2 MG MT GUM: 2.0000 mg | CHEWING_GUM | OROMUCOSAL | Status: DC | PRN

## 2017-10-22 MED ORDER — RISPERIDONE 2 MG PO TABS
2.0000 mg | ORAL_TABLET | Freq: Every day | ORAL | Status: DC
  Administered 2017-10-22 – 2017-10-24 (×3): 2 mg via ORAL
  Filled 2017-10-22 (×6): qty 1

## 2017-10-22 MED ORDER — HYDROXYZINE HCL 50 MG PO TABS: 50.0000 mg | ORAL_TABLET | Freq: Three times a day (TID) | ORAL | Status: DC | PRN

## 2017-10-22 MED ORDER — OXCARBAZEPINE 150 MG PO TABS
150.0000 mg | ORAL_TABLET | Freq: Three times a day (TID) | ORAL | Status: DC
  Administered 2017-10-22 – 2017-10-25 (×9): 150 mg via ORAL
  Filled 2017-10-22 (×16): qty 1

## 2017-10-22 MED ORDER — CLONAZEPAM 0.5 MG PO TABS: 0.5000 mg | ORAL_TABLET | Freq: Every day | ORAL | Status: DC | PRN

## 2017-10-22 NOTE — Tx Team (Signed)
Initial Treatment Plan 10/22/2017 4:55 PM [name] TWK:[id number]    PATIENT STRESSORS: Medication change or noncompliance Occupational concerns   PATIENT STRENGTHS: Ability for insight Average or above average intelligence Capable of independent living General fund of knowledge Motivation for treatment/growth   PATIENT IDENTIFIED PROBLEMS: Depression Anxiety Suicidal thoughts "I want to feel better" "Figure out why I'm having so many suicidal ideations"                     DISCHARGE CRITERIA:  Ability to meet basic life and health needs Improved stabilization in mood, thinking, and/or behavior Verbal commitment to aftercare and medication compliance  PRELIMINARY DISCHARGE PLAN: Attend aftercare/continuing care group Return to previous living arrangement  PATIENT/FAMILY INVOLVEMENT: This treatment plan has been presented to and reviewed with the patient, Rita Browning, and/or family member, .  The patient and family have been given the opportunity to ask questions and make suggestions.  Kendricks Reap, [place], [place] 10/22/2017, 4:55 PM

## 2017-10-22 NOTE — Progress Notes (Signed)
Nursing Progress Note: 7p-7a D: Pt currently presents with a depressed/anxious/sarcastic affect and behavior. Pt states "The only thoughts I am having are ones to get out of here." Interacting minimally with the milieu. Pt reports good sleep during the previous night with current medication regimen.   A: Pt provided with medications per providers orders. Pt's labs and vitals were monitored throughout the night. Pt supported emotionally and encouraged to express concerns and questions. Pt educated on medications.  R: Pt's safety ensured with 15 minute and environmental checks. Pt currently denies SI, HI, and AVH. Pt verbally contracts to seek staff if SI,HI, or AVH occurs and to consult with staff before acting on any harmful thoughts. Will continue to monitor.

## 2017-10-22 NOTE — H&P (Signed)
Behavioral Health Medical Screening Exam  Rita Browning is an 37 y.o. female patent presents as a walk in at [facility] with complaints of suicidal ideation with plan and intent.  Patient unable to contract for safety.    Total Time spent with patient: 30 minutes  Psychiatric Specialty Exam: Physical Exam  Vitals reviewed. Constitutional: She is oriented to person, place, and time. She appears well-developed.  Obese   Neck: Normal range of motion. Neck supple.  Respiratory: Effort normal.  Musculoskeletal: Normal range of motion.  Neurological: She is alert and oriented to person, place, and time.  Skin: Skin is warm and dry.  Psychiatric: Her speech is normal. Her mood appears anxious. Cognition and memory are normal. She expresses impulsivity. She exhibits a depressed mood. She expresses suicidal ideation. She expresses suicidal plans.    Review of Systems  Psychiatric/Behavioral: Positive for depression and suicidal ideas. The patient is nervous/anxious.   All other systems reviewed and are negative.   Blood pressure 131/74, pulse (!) 104, temperature 98.5 F (36.9 C), resp. rate 18, SpO2 100 %.There is no height or weight on file to calculate BMI.  General Appearance: Casual  Eye Contact:  Good  Speech:  Clear and Coherent and Normal Rate  Volume:  Normal  Mood:  Depressed and Irritable  Affect:  Depressed and Flat  Thought Process:  Coherent and Disorganized  Orientation:  Full (Time, Place, and Person)  Thought Content:  Logical  Suicidal Thoughts:  Yes.  with intent/plan  Homicidal Thoughts:  No  Memory:  Immediate;   Good Recent;   Good Remote;   Good  Judgement:  Impaired  Insight:  Lacking  Psychomotor Activity:  Normal  Concentration: Concentration: Good and Attention Span: Good  Recall:  Good  Fund of Knowledge:Good  Language: Good  Akathisia:  No  Handed:  Right  AIMS (if indicated):     Assets:  Communication Skills Desire for  Improvement Housing Intimacy Physical Health Social Support  Sleep:       Musculoskeletal: Strength & Muscle Tone: within normal limits Gait & Station: normal Patient leans: N/A  Blood pressure 131/74, pulse (!) 104, temperature 98.5 F (36.9 C), resp. rate 18, SpO2 100 %.  Recommendations:  Inpatient psychiatric treatment  Based on my evaluation the patient does not appear to have an emergency medical condition.  Shuvon Rankin, NP 10/22/2017, 1:53 PM

## 2017-10-22 NOTE — BHH Group Notes (Signed)
[place] Group Notes:  (Nursing/MHT/Case Management/Adjunct)  Date:  10/22/2017  Time:  5:51 PM  Type of Therapy:  Psychoeducational Skills  Participation Level:  Active  Participation Quality:  Appropriate  Affect:  Appropriate  Cognitive:  Alert  Insight:  Appropriate  Engagement in Group:  Engaged  Modes of Intervention:  Activity  Summary of Progress/Problems: Patient attended and participated.  Coralyn Mark Perline Awe 10/22/2017, 5:51 PM
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
## 2017-10-23 DIAGNOSIS — Z818 Family history of other mental and behavioral disorders: Secondary | ICD-10-CM

## 2017-10-23 DIAGNOSIS — F3181 Bipolar II disorder: Secondary | ICD-10-CM

## 2017-10-23 DIAGNOSIS — R45851 Suicidal ideations: Secondary | ICD-10-CM

## 2017-10-23 DIAGNOSIS — F332 Major depressive disorder, recurrent severe without psychotic features: Principal | ICD-10-CM

## 2017-10-23 DIAGNOSIS — F17210 Nicotine dependence, cigarettes, uncomplicated: Secondary | ICD-10-CM

## 2017-10-23 MED ORDER — HYDROXYZINE HCL 25 MG PO TABS: 25.0000 mg | ORAL_TABLET | Freq: Four times a day (QID) | ORAL | Status: DC | PRN

## 2017-10-23 MED ORDER — CLONAZEPAM 0.5 MG PO TABS: 0.5000 mg | ORAL_TABLET | Freq: Two times a day (BID) | ORAL | Status: DC | PRN

## 2017-10-23 MED ORDER — CLONAZEPAM 0.5 MG PO TABS
0.5000 mg | ORAL_TABLET | Freq: Three times a day (TID) | ORAL | Status: DC | PRN
  Administered 2017-10-23: 0.5 mg via ORAL
  Filled 2017-10-23: qty 1

## 2017-10-23 MED ORDER — CLONAZEPAM 0.5 MG PO TABS: 0.5000 mg | ORAL_TABLET | Freq: Three times a day (TID) | ORAL | Status: DC | PRN

## 2017-10-23 MED ORDER — TRAZODONE HCL 150 MG PO TABS
150.0000 mg | ORAL_TABLET | Freq: Every day | ORAL | Status: DC
  Administered 2017-10-23: 150 mg via ORAL
  Filled 2017-10-23 (×2): qty 1

## 2017-10-23 MED ORDER — HYDROXYZINE HCL 50 MG PO TABS
50.0000 mg | ORAL_TABLET | Freq: Every evening | ORAL | Status: DC | PRN
  Administered 2017-10-24: 50 mg via ORAL
  Filled 2017-10-23 (×2): qty 1

## 2017-10-23 MED ORDER — TRAZODONE HCL 150 MG PO TABS: 150.0000 mg | ORAL_TABLET | Freq: Every evening | ORAL | Status: DC | PRN

## 2017-10-23 MED ORDER — PRAZOSIN HCL 2 MG PO CAPS
2.0000 mg | ORAL_CAPSULE | Freq: Every day | ORAL | Status: DC
  Administered 2017-10-24: 2 mg via ORAL
  Filled 2017-10-23 (×3): qty 1

## 2017-10-23 NOTE — Tx Team (Signed)
Interdisciplinary Treatment and Diagnostic Plan Update  10/23/2017 Time of Session: 0907 Stassi Fadely MRN: [id number]  Principal Diagnosis: <principal problem not specified>  Secondary Diagnoses: Active Problems:   MDD (major depressive disorder), recurrent severe, without psychosis ([place])   Current Medications:  Current Facility-Administered Medications  Medication Dose Route Frequency Provider Last Rate Last Dose  . busPIRone (BUSPAR) tablet 15 mg  15 mg Oral TID Rankin, Shuvon B, NP   15 mg at 10/23/17 1256  . clonazePAM (KLONOPIN) tablet 0.5 mg  0.5 mg Oral Daily PRN Rankin, Shuvon B, NP      . hydrochlorothiazide (MICROZIDE) capsule 12.5 mg  12.5 mg Oral Daily Rankin, Shuvon B, NP      . hydrOXYzine (ATARAX/VISTARIL) tablet 50 mg  50 mg Oral TID PRN Rankin, Shuvon B, NP      . nicotine polacrilex (NICORETTE) gum 2 mg  2 mg Oral PRN Cobos, Myer Peer, MD      . OXcarbazepine (TRILEPTAL) tablet 150 mg  150 mg Oral TID Lindon Romp A, NP   150 mg at 10/23/17 1256  . [START ON 10/24/2017] prazosin (MINIPRESS) capsule 2 mg  2 mg Oral QHS Cobos, Fernando A, MD      . risperiDONE (RISPERDAL) tablet 2 mg  2 mg Oral QHS Rankin, Shuvon B, NP   2 mg at 10/22/17 2135  . traZODone (DESYREL) tablet 150 mg  150 mg Oral QHS PRN Cobos, Myer Peer, MD       PTA Medications: Medications Prior to Admission  Medication Sig Dispense Refill Last Dose  . busPIRone (BUSPAR) 15 MG tablet Take 15 mg by mouth 3 (three) times daily.   Taking  . clonazePAM (KLONOPIN) 1 MG tablet Take 0.5 mg by mouth as needed for anxiety.   Taking  . hydrochlorothiazide (MICROZIDE) 12.5 MG capsule Take 1 capsule (12.5 mg total) by mouth daily. 30 capsule 1   . hydrochlorothiazide (MICROZIDE) 12.5 MG capsule Take 1 capsule (12.5 mg total) by mouth daily. 90 capsule 1   . hydrOXYzine (ATARAX/VISTARIL) 50 MG tablet Take 50 mg by mouth every 4 (four) hours as needed.   Taking  . OXcarbazepine (TRILEPTAL) 150 MG tablet Take 150 mg by  mouth 3 (three) times daily.   Taking  . prazosin (MINIPRESS) 2 MG capsule Take 1 capsule (2 mg total) by mouth at bedtime. For nightmares (Patient taking differently: Take 2 mg by mouth 2 (two) times daily. For nightmares) 30 capsule 0 Taking  . risperiDONE (RISPERDAL) 2 MG tablet Take 2 mg by mouth at bedtime.   Taking  . traZODone (DESYREL) 150 MG tablet Take 300 mg by mouth at bedtime.   Taking    Patient Stressors: Medication change or noncompliance Occupational concerns  Patient Strengths: Ability for insight Average or above average intelligence Capable of independent living General fund of knowledge Motivation for treatment/growth  Treatment Modalities: Medication Management, Group therapy, Case management,  1 to 1 session with clinician, Psychoeducation, Recreational therapy.   Physician Treatment Plan for Primary Diagnosis: <principal problem not specified> Long Term Goal(s): Improvement in symptoms so as ready for discharge Improvement in symptoms so as ready for discharge   Short Term Goals: Ability to identify changes in lifestyle to reduce recurrence of condition will improve Ability to identify and develop effective coping behaviors will improve Ability to identify changes in lifestyle to reduce recurrence of condition will improve Ability to maintain clinical measurements within normal limits will improve  Medication Management: Evaluate patient's response, side effects,  and tolerance of medication regimen.  Therapeutic Interventions: 1 to 1 sessions, Unit Group sessions and Medication administration.  Evaluation of Outcomes: Progressing  Physician Treatment Plan for Secondary Diagnosis: Active Problems:   MDD (major depressive disorder), recurrent severe, without psychosis ([place])  Long Term Goal(s): Improvement in symptoms so as ready for discharge Improvement in symptoms so as ready for discharge   Short Term Goals: Ability to identify changes in lifestyle to  reduce recurrence of condition will improve Ability to identify and develop effective coping behaviors will improve Ability to identify changes in lifestyle to reduce recurrence of condition will improve Ability to maintain clinical measurements within normal limits will improve     Medication Management: Evaluate patient's response, side effects, and tolerance of medication regimen.  Therapeutic Interventions: 1 to 1 sessions, Unit Group sessions and Medication administration.  Evaluation of Outcomes: Progressing   RN Treatment Plan for Primary Diagnosis: <principal problem not specified> Long Term Goal(s): Knowledge of disease and therapeutic regimen to maintain health will improve  Short Term Goals: Ability to identify and develop effective coping behaviors will improve and Compliance with prescribed medications will improve  Medication Management: RN will administer medications as ordered by provider, will assess and evaluate patient's response and provide education to patient for prescribed medication. RN will report any adverse and/or side effects to prescribing provider.  Therapeutic Interventions: 1 on 1 counseling sessions, Psychoeducation, Medication administration, Evaluate responses to treatment, Monitor vital signs and CBGs as ordered, Perform/monitor CIWA, COWS, AIMS and Fall Risk screenings as ordered, Perform wound care treatments as ordered.  Evaluation of Outcomes: Progressing   LCSW Treatment Plan for Primary Diagnosis: <principal problem not specified> Long Term Goal(s): Safe transition to appropriate next level of care at discharge, Engage patient in therapeutic group addressing interpersonal concerns.  Short Term Goals: Engage patient in aftercare planning with referrals and resources, Increase social support and Increase skills for wellness and recovery  Therapeutic Interventions: Assess for all discharge needs, 1 to 1 time with Social worker, Explore available  resources and support systems, Assess for adequacy in community support network, Educate family and significant other(s) on suicide prevention, Complete Psychosocial Assessment, Interpersonal group therapy.  Evaluation of Outcomes: Progressing   Progress in Treatment: Attending groups: Yes. Participating in groups: Yes. Taking medication as prescribed: Yes. Toleration medication: Yes. Family/Significant other contact made: No, will contact:  when given permission Patient understands diagnosis: Yes. Discussing patient identified problems/goals with staff: Yes. Medical problems stabilized or resolved: Yes. Denies suicidal/homicidal ideation: Yes. Issues/concerns per patient self-inventory: No. Other: none  New problem(s) identified: No, Describe:  none  New Short Term/Long Term Goal(s):  Patient Goals:    Discharge Plan or Barriers:   Reason for Continuation of Hospitalization: Depression Medication stabilization  Estimated Length of Stay: 3-5 days  Attendees: Patient: 10/23/2017   Physician: Dr Parke Poisson, MD 10/23/2017   Nursing: Vira Browns, RN 10/23/2017   RN Care Manager: 10/23/2017   Social Worker: Lurline Idol, LCSW 10/23/2017   Recreational Therapist:  10/23/2017   Other:  10/23/2017   Other:  10/23/2017   Other: 10/23/2017     Scribe for Treatment Team: Joanne Chars, [place] 10/23/2017 3:46 PM

## 2017-10-23 NOTE — Progress Notes (Signed)
Recreation Therapy Notes  Date: 6.28.19 Time: 0930 Location: 300 Hall Dayroom  Group Topic: Stress Management  Goal Area(s) Addresses:  Patient will verbalize importance of using healthy stress management.  Patient will identify positive emotions associated with healthy stress management.   Intervention: Stress Management  Activity :  [employer].  LRT introduced the stress management technique of meditation.  LRT read a script on how mountains stay the same in spite of the things that go on around them.  Education:  Stress Management, Discharge Planning.   Education Outcome: Acknowledges edcuation/In group clarification offered/Needs additional education  Clinical Observations/Feedback: Pt did not attend group.    Victorino Sparrow, LRT/CTRS         Victorino Sparrow A 10/23/2017 12:21 PM
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
## 2017-10-23 NOTE — Progress Notes (Signed)
The focus of this group is to help patients review their daily goal of treatment and discuss progress on daily workbooks. Pt attended the evening group session and responded to all discussion prompts from the [place]. Pt shared that today was a good day on the unit, the highlight of which was enjoying the company of her peers in the dayroom. "We've been laughing a lot."  When asked for one thing she would do differently upon discharge to stay well, Pt replied: "I'm going to take my medication as prescribed and not skip doses whenever I feel like it." Rita Browning also mentioned feeling the need to communicate her feelings better to those around her rather than keeping it all inside.  Rita Browning reported having no additional needs from Nursing Staff this evening and her affect was appropriate.

## 2017-10-23 NOTE — Progress Notes (Signed)
D pt is observed OOB UAL on the 400 hall today. She tolerates this well. She integrates well with her peers.She watches TV in the dayroom, chit chats with staff and other patients. SHe enjoys laughing at ehrslef " to get other people around me feeling better about themselves".     A SHe completed her daily assessment and on this she wrote she deneid SI today and she rated her depression, hopelessness and anxeity " 3/2/4", respectively An EKG ( for baseline) was obtained and shown to Dr. Parke Poisson ( " result was prolonged Qt / Qtc ") . Pt without any cardiac symptoms. Pt refused am dose of HCTZ and BP checked ( per pt request) at 1500 -= 121/83 HRR 85.     R Safety is in place.

## 2017-10-23 NOTE — Plan of Care (Signed)
  Problem: Education: Goal: Emotional status will improve Outcome: Progressing   
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
## 2017-10-24 MED ORDER — IBUPROFEN 400 MG PO TABS
400.0000 mg | ORAL_TABLET | Freq: Three times a day (TID) | ORAL | Status: DC | PRN
Start: 2017-10-24 — End: 2017-10-25
  Administered 2017-10-24: 400 mg via ORAL
  Filled 2017-10-24: qty 1

## 2017-10-24 NOTE — Progress Notes (Signed)
Adult Psychoeducational Group Note  Date:  10/24/2017 Time:  10:45 AM  Group Topic/Focus:  Goals Group:   The focus of this group is to help patients establish daily goals to achieve during treatment and discuss how the patient can incorporate goal setting into their daily lives to aide in recovery.  Participation Level:  Active  Participation Quality:  Appropriate, Attentive and Sharing  Affect:  Appropriate  Cognitive:  Alert and Appropriate  Insight: Appropriate  Engagement in Group:  Engaged  Modes of Intervention:  Activity, Clarification, Discussion, Education and Support  Additional Comments: Pt completed the self-inventory and attended the goals/orientation group.  Pt plans to discharge tomorrow and stated that she was going to be consistent in taking her medications.  Pt revealed that she had just gotten married a month ago and would forget to take her medications.  Pt told the group that she was going to make a chart to keep track of when and what medications were taken.  Pt's goal was to "be pleasant and to make everyone laugh".  Pt demonstrated compassion for a peer who is feeling hopeless.  Pt appeared confident in discharging tomorrow.   Carolyne Littles F  MHT?LRT?CTRS 10/24/2017, 10:45 AM

## 2017-10-24 NOTE — Progress Notes (Signed)
Writer has observed patient up in the dayroom laughing, talking and playing cards wit peers tonight. She is hopeful to go home on tomorrow. She was complaint with medications scheduled and voiced no complaints. Support given and safety maintained on unit with 15 min checks.

## 2017-10-24 NOTE — Progress Notes (Signed)
D. Pt observed playing cards, interacting well with peers in the dayroom for the majority of the shift.  Per pts self inventory, pt rates her depression, hopelessness and anxiety a 4/0/3, respectively. Pt writes that her most important goal today is "going home".  A. Labs and vitals monitored. Pt compliant with medications. Pt supported emotionally and encouraged to express concerns and ask questions.   R. Pt remains safe with 15 minute checks. Will continue POC.

## 2017-10-24 NOTE — [facility] Group Notes (Signed)
LCSW Group Therapy Note  10/24/2017   10:00--11:00am   Type of Therapy and Topic:  Group Therapy: Anger Cues and Responses  Participation Level:  Active   Description of Group:   In this group, patients learned how to recognize the physical, cognitive, emotional, and behavioral responses they have to anger-provoking situations.  They identified a recent time they became angry and how they reacted.  They analyzed how their reaction was possibly beneficial and how it was possibly unhelpful.  The group discussed a variety of healthier coping skills that could help with such a situation in the future.  Deep breathing was practiced briefly.  Therapeutic Goals: 1. Patients will remember their last incident of anger and how they felt emotionally and physically, what their thoughts were at the time, and how they behaved. 2. Patients will identify how their behavior at that time worked for them, as well as how it worked against them. 3. Patients will explore possible new behaviors to use in future anger situations. 4. Patients will learn that anger itself is normal and cannot be eliminated, and that healthier reactions can assist with resolving conflict rather than worsening situations.  Summary of Patient Progress:  The patient shared that her most recent time of anger was during an argument with her wife and said she felt she was ignored, not listened to and sad.Patient reviewed the circumstance of this last incident of anger and expressed recognition her reaction was counter-productive and that it did not resolve the situation. Patient is open to adding new behaviors to use in the future. Patient understands that anger is a normal human emotion.  Therapeutic Modalities:   Cognitive Behavioral Therapy  Elisabeth Pigeon, LCSW  Rolanda Jay

## 2017-10-24 NOTE — Progress Notes (Signed)
[facility] MD Progress Note  10/24/2017 11:29 AM Ronny Ruddell  MRN:  [id number] Subjective: Patient states she is feeling better today, currently minimizes depression, denies suicidal ideations.  As she improves she is focusing more on discharge and states she is hopeful for discharge soon. Denies medication side effects . Objective : I have reviewed chart notes and have met with patient. 37 year old female, presented to the hospital and encouragement of her therapist, reporting worsening depression, anxiety, self-injurious ideations. Today presents with improved mood, affect, minimizes depression or neurovegetative symptoms of depression at this time.  Presents with a full range of affect, future oriented, looking forward to discharging soon. Currently denies medication side effects. No disruptive or agitated behaviors on unit /she has been visible on the unit, noticed to be interacting appropriately with peers.  She attributes recent worsening symptoms to father's day reminding her of her deceased father and to not taking her medications regularly, states she plans to correct this issue , states " I know it is really important for me to take them every day". Reports she has had good visits from significant other, feels she has a good support system. 10/24/17 repeat EKG- NSR , QTc improved to 457 . Principal Problem:  Bipolar Disorder II  Diagnosis:   Patient Active Problem List   Diagnosis Date Noted  . MDD (major depressive disorder), recurrent severe, without psychosis ([place]) [F33.2] 10/22/2017  . Bipolar II disorder ([place]) [F31.81] 06/09/2017   Total Time spent with patient: 20 minutes  Past Psychiatric History:   Past Medical History:  Past Medical History:  Diagnosis Date  . Anemia   . Anxiety   . Asthma   . Bipolar disorder ([place])   . Blood clot in vein right leg  . Borderline personality disorder ([place])   . Depression   . GERD (gastroesophageal reflux disease)   . Hyperlipidemia   .  Hypertension   . PTSD (post-traumatic stress disorder)   . PTSD (post-traumatic stress disorder)     Past Surgical History:  Procedure Laterality Date  . blood clot removed from right leg    . CHOLECYSTECTOMY    . ORIF ANKLE FRACTURE Right    Family History:  Family History  Problem Relation Age of Onset  . Diabetes Mother   . Hypertension Mother   . Cancer Mother        cervical  . Kidney disease Mother   . Neuropathy Mother   . Diabetes Father   . Hypertension Father   . Heart disease Father        enlarge heart  . Cancer Sister        cervical  . Anxiety disorder Sister   . Seizures Maternal Grandmother   . Hypertension Paternal Grandmother   . Stroke Paternal Grandmother   . Heart disease Paternal Grandmother   . Heart attack Paternal Grandmother    Family Psychiatric  History:  Social History:  Social History   Substance and Sexual Activity  Alcohol Use Yes  . Frequency: Never   Comment: 1-2 drinks/months     Social History   Substance and Sexual Activity  Drug Use No    Social History   Socioeconomic History  . Marital status: Single    Spouse name: Not on file  . Number of children: Not on file  . Years of education: Not on file  . Highest education level: Not on file  Occupational History  . Not on file  Social Needs  . Emergency planning/management officer  strain: Not on file  . Food insecurity:    Worry: Not on file    Inability: Not on file  . Transportation needs:    Medical: Not on file    Non-medical: Not on file  Tobacco Use  . Smoking status: Current Some Day Smoker    Types: E-cigarettes, Cigarettes  . Smokeless tobacco: Never Used  . Tobacco comment: vape. smoke 1-2 cigarrtes/month  Substance and Sexual Activity  . Alcohol use: Yes    Frequency: Never    Comment: 1-2 drinks/months  . Drug use: No  . Sexual activity: Yes  Lifestyle  . Physical activity:    Days per week: Not on file    Minutes per session: Not on file  . Stress: Not on file   Relationships  . Social connections:    Talks on phone: Not on file    Gets together: Not on file    Attends religious service: Not on file    Active member of club or organization: Not on file    Attends meetings of clubs or organizations: Not on file    Relationship status: Not on file  Other Topics Concern  . Not on file  Social History Narrative  . Not on file   Additional Social History:    Pain Medications: see PTA meds Prescriptions: see PTA meds Over the Counter: see PTA meds History of alcohol / drug use?: No history of alcohol / drug abuse  Sleep: Good  Appetite:  Good  Current Medications: Current Facility-Administered Medications  Medication Dose Route Frequency Provider Last Rate Last Dose  . busPIRone (BUSPAR) tablet 15 mg  15 mg Oral TID Rankin, Shuvon B, NP   15 mg at 10/24/17 0820  . clonazePAM (KLONOPIN) tablet 0.5 mg  0.5 mg Oral TID PRN Shauni Henner, Myer Peer, MD   0.5 mg at 10/23/17 1954  . hydrochlorothiazide (MICROZIDE) capsule 12.5 mg  12.5 mg Oral Daily Rankin, Shuvon B, NP   12.5 mg at 10/24/17 0820  . hydrOXYzine (ATARAX/VISTARIL) tablet 25 mg  25 mg Oral Q6H PRN Patriciaann Clan E, PA-C      . hydrOXYzine (ATARAX/VISTARIL) tablet 50 mg  50 mg Oral QHS PRN Laverle Hobby, PA-C      . ibuprofen (ADVIL,MOTRIN) tablet 400 mg  400 mg Oral Q8H PRN Jakori Burkett, Myer Peer, MD      . nicotine polacrilex (NICORETTE) gum 2 mg  2 mg Oral PRN Dayleen Beske, Myer Peer, MD      . OXcarbazepine (TRILEPTAL) tablet 150 mg  150 mg Oral TID Lindon Romp A, NP   150 mg at 10/24/17 0820  . prazosin (MINIPRESS) capsule 2 mg  2 mg Oral QHS Asiel Chrostowski A, MD      . risperiDONE (RISPERDAL) tablet 2 mg  2 mg Oral QHS Rankin, Shuvon B, NP   2 mg at 10/23/17 2209    Lab Results: No results found for this or any previous visit (from the past 48 hour(s)).  Blood Alcohol level:  Lab Results  Component Value Date   ETH <10 76/22/6333    Metabolic Disorder Labs: Lab Results  Component  Value Date   HGBA1C 5.6 10/19/2017   MPG 114.02 10/19/2017   No results found for: PROLACTIN Lab Results  Component Value Date   CHOL 221 (H) 10/19/2017   TRIG 232 (H) 10/19/2017   HDL 26 (L) 10/19/2017   CHOLHDL 8.5 10/19/2017   VLDL 46 (H) 10/19/2017   LDLCALC 149 (H) 10/19/2017  Physical Findings: AIMS: Facial and Oral Movements Muscles of Facial Expression: None, normal Lips and Perioral Area: None, normal Jaw: None, normal Tongue: None, normal,Extremity Movements Upper (arms, wrists, hands, fingers): None, normal Lower (legs, knees, ankles, toes): None, normal, Trunk Movements Neck, shoulders, hips: None, normal, Overall Severity Severity of abnormal movements (highest score from questions above): None, normal Incapacitation due to abnormal movements: None, normal Patient's awareness of abnormal movements (rate only patient's report): No Awareness, Dental Status Current problems with teeth and/or dentures?: No Does patient usually wear dentures?: No  CIWA:    COWS:     Musculoskeletal: Strength & Muscle Tone: within normal limits Gait & Station: normal Patient leans: N/A  Psychiatric Specialty Exam: Physical Exam  ROS denies chest pain, no shortness of breath, no vomiting, no fever  Blood pressure 122/86, pulse 95, temperature 98 F (36.7 C), temperature source Oral, resp. rate 18, height _0  (1.727 m), weight (!) 151 kg (333 lb), SpO2 100 %.Body mass index is 50.63 kg/m.  General Appearance: Improving grooming  Eye Contact:  Good  Speech:  Normal Rate  Volume:  Normal  Mood:  Improving mood, states she feels significantly better, currently minimizes depression  Affect:  Appropriate and Reactive  Thought Process:  Linear and Descriptions of Associations: Intact  Orientation:  Full (Time, Place, and Person)  Thought Content:  No hallucinations, no delusions, not internally preoccupied  Suicidal Thoughts:  No-denies suicidal ideations, no self-injurious  ideations, contracts for safety on unit  Homicidal Thoughts:  No  Memory:  Recent and remote grossly intact  Judgement:  Other:  Improving  Insight:  Improving  Psychomotor Activity:  Normal  Concentration:  Concentration: Good and Attention Span: Good  Recall:  Good  Fund of Knowledge:  Good  Language:  Good  Akathisia:  Negative  Handed:  Right  AIMS (if indicated):     Assets:  Desire for Improvement Resilience  ADL's:  Intact  Cognition:  WNL  Sleep:  Number of Hours: 6.75   Assessment -patient reports she is feeling better today, presents with a fuller range of affect, brighter, and at this time minimizes depression/denies any suicidal ideations.  Currently tolerating medications well. Repeat EKG QTc improved/ WNL ( 457). (Patient reports Vistaril PRN's have been effective received Vistaril at QHS last night, without side effects. )   Treatment Plan Summary: Daily contact with patient to assess and evaluate symptoms and progress in treatment, Medication management, Plan Inpatient treatment and medications as below Encourage group and milieu participation to work on coping skills and symptom reduction. Continue BuSpar 15 mg 3 times a day for anxiety Continue Risperidone 2 mg nightly for mood disorder Continue Minipress 2 mg nightly for PTSD/nightmares Continue Trileptal 150 mg 3 times a day for mood disorder Continue Klonopin 0.5 mg 3 times daily as needed for anxiety. Treatment team working on disposition planning. Check EKG in AM.  Jenne Campus, MD 10/24/2017, 11:29 AM

## 2017-10-25 NOTE — BHH Group Notes (Signed)
Adult Psychoeducational Group Note  Date:  10/25/2017 Time:  4:25 AM  Group Topic/Focus:  Wrap-Up Group:   The focus of this group is to help patients review their daily goal of treatment and discuss progress on daily workbooks.  Participation Level:  Active  Participation Quality:  Sharing  Affect:  Appropriate  Cognitive:  Appropriate  Insight: Appropriate  Engagement in Group:  Supportive  Modes of Intervention:  Discussion  Additional Comments:  Patient attended group. Patient shared experiences, particularly related to the her desire for more support from her mother.   Noel Christmas 10/25/2017, 4:25 AM

## 2017-10-25 NOTE — Discharge Summary (Signed)
Physician Discharge Summary Note  Patient:  Rita Browning is an 37 y.o., female MRN:  [id number] DOB:  [birth year] Patient phone:  [phone] (home)  Patient address:   [address] [place] [address],  Total Time spent with patient: 20 minutes  Date of Admission:  10/22/2017 Date of Discharge: 10/25/17  Reason for Admission:  Worsening depression with SI  Principal Problem: MDD (major depressive disorder), recurrent severe, without psychosis [facility]) Discharge Diagnoses: Patient Active Problem List   Diagnosis Date Noted  . MDD (major depressive disorder), recurrent severe, without psychosis ([place]) [F33.2] 10/22/2017  . Bipolar II disorder ([place]) [F31.81] 06/09/2017    Past Psychiatric History: Reports she has been diagnosed with Bipolar Disorder, Borderline Personality Disorder, PTSD. Describes episodes of depression and episodes of racing thoughts , becoming hyperactive, with decreased need for sleep.  She has had one prior psychiatric admission ( to [facility]) in February 2019 for depression and suicidal ideations. At the time was discharged on Minipress 2 mgrs QHS, Trazodone 150 mgrs QHS, Risperidone 1 mgrs QHS . No history of suicide attempts, (+) history of self cutting , last time was years ago. Reports history of intermittent auditory hallucinations, which she describes as voice of her mother making critical , demeaning remarks  Past Medical History:  Past Medical History:  Diagnosis Date  . Anemia   . Anxiety   . Asthma   . Bipolar disorder ([place])   . Blood clot in vein right leg  . Borderline personality disorder ([place])   . Depression   . GERD (gastroesophageal reflux disease)   . Hyperlipidemia   . Hypertension   . PTSD (post-traumatic stress disorder)   . PTSD (post-traumatic stress disorder)     Past Surgical History:  Procedure Laterality Date  . blood clot removed from right leg    . CHOLECYSTECTOMY    . ORIF ANKLE FRACTURE Right    Family History:  Family  History  Problem Relation Age of Onset  . Diabetes Mother   . Hypertension Mother   . Cancer Mother        cervical  . Kidney disease Mother   . Neuropathy Mother   . Diabetes Father   . Hypertension Father   . Heart disease Father        enlarge heart  . Cancer Sister        cervical  . Anxiety disorder Sister   . Seizures Maternal Grandmother   . Hypertension Paternal Grandmother   . Stroke Paternal Grandmother   . Heart disease Paternal Grandmother   . Heart attack Paternal Grandmother    Family Psychiatric  History: states she thinks mother has bipolar disorder but has not been formally diagnosed, no suicides in family, no substance abuse in family  Social History:  Social History   Substance and Sexual Activity  Alcohol Use Yes  . Frequency: Never   Comment: 1-2 drinks/months     Social History   Substance and Sexual Activity  Drug Use No    Social History   Socioeconomic History  . Marital status: Single    Spouse name: Not on file  . Number of children: Not on file  . Years of education: Not on file  . Highest education level: Not on file  Occupational History  . Not on file  Social Needs  . Financial resource strain: Not on file  . Food insecurity:    Worry: Not on file    Inability: Not on file  . Transportation  needs:    Medical: Not on file    Non-medical: Not on file  Tobacco Use  . Smoking status: Current Some Day Smoker    Types: E-cigarettes, Cigarettes  . Smokeless tobacco: Never Used  . Tobacco comment: vape. smoke 1-2 cigarrtes/month  Substance and Sexual Activity  . Alcohol use: Yes    Frequency: Never    Comment: 1-2 drinks/months  . Drug use: No  . Sexual activity: Yes  Lifestyle  . Physical activity:    Days per week: Not on file    Minutes per session: Not on file  . Stress: Not on file  Relationships  . Social connections:    Talks on phone: Not on file    Gets together: Not on file    Attends religious service: Not on  file    Active member of club or organization: Not on file    Attends meetings of clubs or organizations: Not on file    Relationship status: Not on file  Other Topics Concern  . Not on file  Social History Narrative  . Not on file    Hospital Course:   10/23/17 [facility] MD Assessment: 37 year old female, presented to the hospital voluntarily at the encouragement of her outpatient therapist . She reports she has been more depressed, " more moody and irritable too", more anxious, with suicidal ideations , with thoughts of cutting self or shooting self . She states she recently held a knife to her wrist , but her SO intervened. She attributes worsening mood  to recent father's day, recent anniversary of father's death, and " not taking my medications right, I have been missing doses ". Reports history of intermittent auditory hallucinations, states she hears her mother's voice criticizing her and demeaning her. States that these are chronic, and have been present for years . Has preserved reality testing and no delusions are expressed .  Patient remained on the [facility] unit for 3 days. The patient stabilized on medication and therapy. Patient was discharged on Buspar 15 mg TID, Klonopin 0.5 mg TID PRN, Vistaril 50 mg QID, Trileptal 150 mg TID, Prazosin 2 mg QHS, trazodone 300 mg QHS, and Risperidone 2 mg QHS. Patient has shown improvement with improved mood, affect, sleep, appetite, and interaction. Patient has attended group and participated. Patient has been seen in the day room interacting with peers and staff appropriately. Patient denies any SI/HI/AVH and contracts for safety. Patient agrees to follow up at [facility]. Patient is provided with prescriptions for their medications upon discharge.   Physical Findings: AIMS: Facial and Oral Movements Muscles of Facial Expression: None, normal Lips and Perioral Area: None, normal Jaw: None, normal Tongue: None, normal,Extremity Movements Upper (arms,  wrists, hands, fingers): None, normal Lower (legs, knees, ankles, toes): None, normal, Trunk Movements Neck, shoulders, hips: None, normal, Overall Severity Severity of abnormal movements (highest score from questions above): None, normal Incapacitation due to abnormal movements: None, normal Patient's awareness of abnormal movements (rate only patient's report): No Awareness, Dental Status Current problems with teeth and/or dentures?: No Does patient usually wear dentures?: No  CIWA:    COWS:     Musculoskeletal: Strength & Muscle Tone: within normal limits Gait & Station: normal Patient leans: N/A  Psychiatric Specialty Exam: Physical Exam  Nursing note and vitals reviewed. Constitutional: She is oriented to person, place, and time. She appears well-developed and well-nourished.  Respiratory: Effort normal.  Musculoskeletal: Normal range of motion.  Neurological: She is alert and oriented  to person, place, and time.  Skin: Skin is warm.    Review of Systems  Constitutional: Negative.   HENT: Negative.   Eyes: Negative.   Respiratory: Negative.   Cardiovascular: Negative.   Gastrointestinal: Negative.   Genitourinary: Negative.   Musculoskeletal: Negative.   Skin: Negative.   Neurological: Negative.   Endo/Heme/Allergies: Negative.   Psychiatric/Behavioral: Negative.     Blood pressure 104/87, pulse (!) 122, temperature 98 F (36.7 C), temperature source Oral, resp. rate 16, height 5\' 8"  (1.727 m), weight (!) 151 kg (333 lb), SpO2 100 %.Body mass index is 50.63 kg/m.  General Appearance: Casual  Eye Contact:  Good  Speech:  Clear and Coherent and Normal Rate  Volume:  Normal  Mood:  Euthymic  Affect:  Congruent  Thought Process:  Goal Directed and Descriptions of Associations: Intact  Orientation:  Full (Time, Place, and Person)  Thought Content:  WDL  Suicidal Thoughts:  No  Homicidal Thoughts:  No  Memory:  Immediate;   Good Recent;   Good Remote;   Good   Judgement:  Fair  Insight:  Good  Psychomotor Activity:  Normal  Concentration:  Concentration: Good and Attention Span: Good  Recall:  Good  Fund of Knowledge:  Good  Language:  Good  Akathisia:  No  Handed:  Right  AIMS (if indicated):     Assets:  Communication Skills Desire for Improvement Financial Resources/Insurance Housing Leisure Time Social Support Transportation  ADL's:  Intact  Cognition:  WNL  Sleep:  Number of Hours: 6.75     Have you used any form of tobacco in the last 30 days? (Cigarettes, Smokeless Tobacco, Cigars, and/or Pipes): Yes  Has this patient used any form of tobacco in the last 30 days? (Cigarettes, Smokeless Tobacco, Cigars, and/or Pipes) Yes, Yes, A prescription for an FDA-approved tobacco cessation medication was offered at discharge and the patient refused  Blood Alcohol level:  Lab Results  Component Value Date   ETH <10 79/89/2119    Metabolic Disorder Labs:  Lab Results  Component Value Date   HGBA1C 5.6 10/19/2017   MPG 114.02 10/19/2017   No results found for: PROLACTIN Lab Results  Component Value Date   CHOL 221 (H) 10/19/2017   TRIG 232 (H) 10/19/2017   HDL 26 (L) 10/19/2017   CHOLHDL 8.5 10/19/2017   VLDL 46 (H) 10/19/2017   LDLCALC 149 (H) 10/19/2017    See Psychiatric Specialty Exam and Suicide Risk Assessment completed by Attending Physician prior to discharge.  Discharge destination:  Home  Is patient on multiple antipsychotic therapies at discharge:  No   Has Patient had three or more failed trials of antipsychotic monotherapy by history:  No  Recommended Plan for Multiple Antipsychotic Therapies: NA   Allergies as of 10/25/2017      Reactions   Codeine Hives, Nausea Only      Medication List    TAKE these medications     Indication  busPIRone 15 MG tablet Commonly known as:  BUSPAR Take 15 mg by mouth 3 (three) times daily.  Indication:  Anxiety Disorder   clonazePAM 1 MG tablet Commonly known  as:  KLONOPIN Take 0.5 mg by mouth as needed for anxiety.  Indication:  anxiety   hydrochlorothiazide 12.5 MG capsule Commonly known as:  MICROZIDE Take 1 capsule (12.5 mg total) by mouth daily. What changed:  Another medication with the same name was removed. Continue taking this medication, and follow the directions you see here.  Indication:  High Blood Pressure Disorder   hydrOXYzine 50 MG tablet Commonly known as:  ATARAX/VISTARIL Take 50 mg by mouth every 4 (four) hours as needed.  Indication:  Feeling Anxious   OXcarbazepine 150 MG tablet Commonly known as:  TRILEPTAL Take 150 mg by mouth 3 (three) times daily.  Indication:  mood stability   prazosin 2 MG capsule Commonly known as:  MINIPRESS Take 1 capsule (2 mg total) by mouth at bedtime. For nightmares What changed:    when to take this  additional instructions  Indication:  PTSD symptoms   risperiDONE 2 MG tablet Commonly known as:  RISPERDAL Take 2 mg by mouth at bedtime.  Indication:  Mood stability   traZODone 150 MG tablet Commonly known as:  DESYREL Take 300 mg by mouth at bedtime.  Indication:  Trouble Sleeping, mood stability      Follow-up Information    [place], [place]. Go on 10/28/2017.   Why:  Appointment with your therapist Joseph Art is already scheduled by you for 9:00am on 10/28/17, and your psychiatrist will also see you that week (you can find out exact time when you see therapist). Contact information: [place] [address] [phone]           Follow-up recommendations:  Continue activity as tolerated. Continue diet as recommended by your PCP. Ensure to keep all appointments with outpatient providers.  Comments:  Patient is instructed prior to discharge to: Take all medications as prescribed by his/her mental healthcare provider. Report any adverse effects and or reactions from the medicines to his/her outpatient provider promptly. Patient has been instructed & cautioned:  To not engage in alcohol and or illegal drug use while on prescription medicines. In the event of worsening symptoms, patient is instructed to call the crisis hotline, 911 and or go to the nearest ED for appropriate evaluation and treatment of symptoms. To follow-up with his/her primary care provider for your other medical issues, concerns and or health care needs.    Signed: [place], FNP 10/25/2017, 3:40 PM

## 2017-10-25 NOTE — BHH Group Notes (Signed)
[facility] LCSW Group Therapy Note  Date/Time:  10/25/2017 9:00-10:00 or 10:00-11:00AM  Type of Therapy and Topic:  Group Therapy:  Healthy and Unhealthy Supports  Participation Level:  Active   Description of Group:  Patients in this group were introduced to the idea of adding a variety of healthy supports to address the various needs in their lives.Patients discussed what additional healthy supports could be helpful in their recovery and wellness after discharge in order to prevent future hospitalizations.   An emphasis was placed on using counselor, doctor, therapy groups, 12-step groups, and problem-specific support groups to expand supports.  They also worked as a group on developing a specific plan for several patients to deal with unhealthy supports through [place], psychoeducation with loved ones, and even termination of relationships.   Therapeutic Goals:   1)  discuss importance of adding supports to stay well once out of the hospital  2)  compare healthy versus unhealthy supports and identify some examples of each  3)  generate ideas and descriptions of healthy supports that can be added  4)  offer mutual support about how to address unhealthy supports  5)  encourage active participation in and adherence to discharge plan    Summary of Patient Progress:  The patient stated that current healthy supports in her life include her wife, family and friends.  Patient is able to articulate the importance of having healthy supports in their life to help in the recovery process. Patient is able to discern healthy supports from unhealthy ones.     Therapeutic Modalities:   Motivational Interviewing Brief Solution-Focused Therapy   Elisabeth Pigeon, LCSW  Rolanda Jay

## 2017-10-25 NOTE — Progress Notes (Signed)
  [facility] Adult Case Management Discharge Plan :  Will you be returning to the same living situation after discharge:  Yes,  with wife At discharge, do you have transportation home?: Yes,  wife transporting Do you have the ability to pay for your medications: Yes,  denies barriers  Release of information consent forms completed and turned in to Medical Records by CSW.   Patient to Follow up at: Follow-up Information    [place] [place]. Go on 10/28/2017.   Why:  Appointment with your therapist Joseph Art is already scheduled by you for 9:00am on 10/28/17, and your psychiatrist will also see you that week (you can find out exact time when you see therapist). Contact information: [address] [place] [address] [phone]           Next level of care provider has access to [place] and Suicide Prevention discussed: Yes,  with wife  Have you used any form of tobacco in the last 30 days? (Cigarettes, Smokeless Tobacco, Cigars, and/or Pipes): Yes  Has patient been referred to the Quitline?: Patient refused referral  Patient has been referred for addiction treatment: N/A  Maretta Los, LCSW 10/25/2017, 12:24 PM

## 2017-10-25 NOTE — Progress Notes (Signed)
Patient ID: Rita Browning, female   DOB: [birth year], 37 y.o.   MRN: [id number]    Pt was discharged home with her partner, she reported being ready for discharge. Pt reported that her depression was a 1, her hopelessness was a 0, and her anxiety was a 1. Pt reported that she was negative SI/HI, no AH/VH noted. Pt reported that her goal for today was to go home, communicate better, and stay positive. No other issues or concerns noted.

## 2017-10-25 NOTE — BHH Suicide Risk Assessment (Signed)
[facility] INPATIENT:  Family/Significant Other Suicide Prevention Education  Suicide Prevention Education:  Education Completed; Wife Rita Browning (in person when she came to pick up patient) has been identified by the patient as the family member/significant other with whom the patient will be residing, and identified as the person(s) who will aid the patient in the event of a mental health crisis (suicidal ideations/suicide attempt).  With written consent from the patient, the family member/significant other has been provided the following suicide prevention education, prior to the and/or following the discharge of the patient.  The suicide prevention education provided includes the following:  Suicide risk factors  Suicide prevention and interventions  National Suicide Hotline telephone number  [facility] assessment telephone number  [facility] Emergency Assistance [place] and/or Residential Mobile Crisis Unit telephone number  Request made of family/significant other to:  Remove weapons (e.g., guns, rifles, knives), all items previously/currently identified as safety concern.    Remove drugs/medications (over-the-counter, prescriptions, illicit drugs), all items previously/currently identified as a safety concern.  The family member/significant other verbalizes understanding of the suicide prevention education information provided.  The family member/significant other agrees to remove the items of safety concern listed above.  Rita Browning 10/25/2017, 12:25 PM

## 2017-10-25 NOTE — BHH Suicide Risk Assessment (Addendum)
[facility] Discharge Suicide Risk Assessment   Principal Problem: Depression Discharge Diagnoses: Bipolar Disorder II by history  Patient Active Problem List   Diagnosis Date Noted  . MDD (major depressive disorder), recurrent severe, without psychosis ([place]) [F33.2] 10/22/2017  . Bipolar II disorder ([place]) [F31.81] 06/09/2017    Total Time spent with patient: 30 minutes  Musculoskeletal: Strength & Muscle Tone: within normal limits Gait & Station: normal Patient leans: N/A  Psychiatric Specialty Exam: ROS no headache, no chest pain, no shortness of breath, no vomiting , no diarrhea, no rash, no fever  Blood pressure 104/87, pulse (!) 122, temperature 98 F (36.7 C), temperature source Oral, resp. rate 16, height 5\' 8"  (1.727 m), weight (!) 151 kg (333 lb), SpO2 100 %.Body mass index is 50.63 kg/m.  General Appearance: Well Groomed  Eye Contact::  Good  Speech:  Normal Rate409  Volume:  Normal  Mood:  reports she feels better, denies feeling depressed, presents euthymic   Affect:  Appropriate and reactive   Thought Process:  Linear and Descriptions of Associations: Intact  Orientation:  Other:  fully alert and attentive  Thought Content:  no hallucinations, no delusions, not internally preoccupied   Suicidal Thoughts:  No denies suicidal or self injurious ideations, denies homicidal or violent ideations  Homicidal Thoughts:  No  Memory:  recent and remote grossly intact   Judgement:  Other:  improving   Insight:  improving   Psychomotor Activity:  Normal  Concentration:  Good  Recall:  Good  Fund of Knowledge:Good  Language: Good  Akathisia:  Negative  Handed:  Right  AIMS (if indicated):     Assets:  Communication Skills Desire for Improvement Resilience  Sleep:  Number of Hours: 6.75  Cognition: WNL  ADL's:  Intact   Mental Status Per Nursing Assessment::   On Admission:  Suicidal ideation indicated by patient, Self-harm thoughts, Self-harm behaviors, Intention to act on  suicide plan  Demographic Factors:  37 year old married female, no children, currently unemployed  Loss Factors: Some relationship tension, recent father's day, reminding her of deceased parent .  Historical Factors: Has been diagnosed with Bipolar Disorder, Borderline Personality Disorder and PTSD in the past, history of self cutting.  Risk Reduction Factors:   Sense of responsibility to family, Living with another person, especially a relative, Positive social support and Positive coping skills or problem solving skills  Continued Clinical Symptoms:  At this time patient presents alert, attentive,well related, pleasant, calm, mood described as improved , presents euthymic at this time, denies depression, affect is appropriate and reactive at this time, no thought disorder , no suicidal or self injurious ideations, no homicidal or violent ideations, no hallucinations,no delusions, not internally preoccupied . Denies medication side effects. Side effects reviewed. 6/30 EKG  NSR, QTc 474.  Behavior on unit calm and in good control, pleasant on approach.  Cognitive Features That Contribute To Risk:  No gross cognitive deficits noted upon discharge. Is alert , attentive, and oriented x 3    Suicide Risk:  Mild:  Suicidal ideation of limited frequency, intensity, duration, and specificity.  There are no identifiable plans, no associated intent, mild dysphoria and related symptoms, good self-control (both objective and subjective assessment), few other risk factors, and identifiable protective factors, including available and accessible social support.    Plan Of Care/Follow-up recommendations:  Activity:  as tolerated  Diet:  heeart healthy Tests:  NA Other:  See below  Patient expresses readiness for discharge and is leaving  unit in good spirits. No current grounds for involuntary commitment . Patient plans to follow up at [facility] for ongoing outpatient psychiatric care and  therapy She follows up at [facility] in [place] for medical issues     Jenne Campus, MD 10/25/2017, 11:10 AM

## 2017-11-12 ENCOUNTER — Ambulatory Visit: Payer: Self-pay | Admitting: Physician Assistant

## 2017-11-12 ENCOUNTER — Encounter: Payer: Self-pay | Admitting: Physician Assistant

## 2017-11-12 VITALS — BP 132/87 | HR 100 | Temp 97.7°F | Ht 67.75 in | Wt 333.0 lb

## 2017-11-12 DIAGNOSIS — I10 Essential (primary) hypertension: Secondary | ICD-10-CM

## 2017-11-12 DIAGNOSIS — E785 Hyperlipidemia, unspecified: Secondary | ICD-10-CM

## 2017-11-12 DIAGNOSIS — F39 Unspecified mood [affective] disorder: Secondary | ICD-10-CM

## 2017-11-12 DIAGNOSIS — R945 Abnormal results of liver function studies: Secondary | ICD-10-CM

## 2017-11-12 DIAGNOSIS — R7989 Other specified abnormal findings of blood chemistry: Secondary | ICD-10-CM

## 2017-11-12 MED ORDER — ATORVASTATIN CALCIUM 10 MG PO TABS: 10.0000 mg | ORAL_TABLET | Freq: Every day | ORAL | 3 refills | Status: DC

## 2017-11-12 MED ORDER — ATORVASTATIN CALCIUM 10 MG PO TABS: 10.0000 mg | ORAL_TABLET | Freq: Every day | ORAL | 1 refills | Status: DC

## 2017-11-12 NOTE — Patient Instructions (Signed)

## 2017-11-12 NOTE — Progress Notes (Signed)
BP 132/87 (BP Location: Left Arm, Patient Position: Sitting, Cuff Size: Normal)   Pulse 100   Temp 97.7 F (36.5 C)   Ht 5' 7.75" (1.721 m)   Wt (!) 333 lb (151 kg)   SpO2 97%   BMI 51.01 kg/m    Subjective:    Patient ID: Rita Browning, female    DOB: [birth year], 37 y.o.   MRN: [id number]  HPI: Rita Browning is a 37 y.o. female presenting on 11/12/2017 for Hypertension   HPI   Pt is doing well.  She continues with [place] specialist.  Relevant past medical, surgical, family and social history reviewed and updated as indicated. Interim medical history since our last visit reviewed. Allergies and medications reviewed and updated.   Current Outpatient Medications:  .  busPIRone (BUSPAR) 15 MG tablet, Take 15 mg by mouth 3 (three) times daily., Disp: , Rfl:  .  clonazePAM (KLONOPIN) 1 MG tablet, Take 0.5 mg by mouth as needed for anxiety., Disp: , Rfl:  .  hydrochlorothiazide (MICROZIDE) 12.5 MG capsule, Take 1 capsule (12.5 mg total) by mouth daily., Disp: 90 capsule, Rfl: 1 .  hydrOXYzine (ATARAX/VISTARIL) 50 MG tablet, Take 50 mg by mouth every 4 (four) hours as needed., Disp: , Rfl:  .  Oxcarbazepine (TRILEPTAL) 300 MG tablet, Take 300 mg by mouth 3 (three) times daily., Disp: , Rfl:  .  prazosin (MINIPRESS) 2 MG capsule, Take 1 capsule (2 mg total) by mouth at bedtime. For nightmares (Patient taking differently: Take 2 mg by mouth 2 (two) times daily. For nightmares), Disp: 30 capsule, Rfl: 0 .  risperiDONE (RISPERDAL) 2 MG tablet, Take 2 mg by mouth at bedtime., Disp: , Rfl:    Review of Systems  Constitutional: Negative for appetite change, chills, diaphoresis, fatigue, fever and unexpected weight change.  HENT: Negative for congestion, dental problem, drooling, ear pain, facial swelling, hearing loss, mouth sores, sneezing, sore throat, trouble swallowing and voice change.   Eyes: Negative for pain, discharge, redness, itching and visual disturbance.  Respiratory: Negative for  cough, choking, shortness of breath and wheezing.   Cardiovascular: Negative for chest pain, palpitations and leg swelling.  Gastrointestinal: Negative for abdominal pain, blood in stool, constipation, diarrhea and vomiting.  Endocrine: Negative for cold intolerance, heat intolerance and polydipsia.  Genitourinary: Negative for decreased urine volume, dysuria and hematuria.  Musculoskeletal: Negative for arthralgias, back pain and gait problem.  Skin: Negative for rash.  Allergic/Immunologic: Negative for environmental allergies.  Neurological: Positive for headaches. Negative for seizures, syncope and light-headedness.  Hematological: Negative for adenopathy.  Psychiatric/Behavioral: Positive for dysphoric mood. Negative for agitation and suicidal ideas. The patient is nervous/anxious.     Per HPI unless specifically indicated above     Objective:    BP 132/87 (BP Location: Left Arm, Patient Position: Sitting, Cuff Size: Normal)   Pulse 100   Temp 97.7 F (36.5 C)   Ht 5' 7.75" (1.721 m)   Wt (!) 333 lb (151 kg)   SpO2 97%   BMI 51.01 kg/m   Wt Readings from Last 3 Encounters:  11/12/17 (!) 333 lb (151 kg)  10/15/17 (!) 334 lb 8 oz (151.7 kg)  06/08/17 (!) 331 lb (150.1 kg)    Physical Exam  Constitutional: She is oriented to person, place, and time. She appears well-developed and well-nourished.  HENT:  Head: Normocephalic and atraumatic.  Neck: Neck supple.  Cardiovascular: Normal rate and regular rhythm.  Pulmonary/Chest: Effort normal and breath sounds normal.  Abdominal: Soft. Bowel sounds are normal. She exhibits no mass. There is no hepatosplenomegaly. There is no tenderness.  Musculoskeletal: She exhibits no edema.  Lymphadenopathy:    She has no cervical adenopathy.  Neurological: She is alert and oriented to person, place, and time.  Skin: Skin is warm and dry.  Psychiatric: She has a normal mood and affect. Her behavior is normal.  Vitals  reviewed.   Results for orders placed or performed during the hospital encounter of 10/19/17  CBC  Result Value Ref Range   WBC 6.8 4.0 - 10.5 K/uL   RBC 4.11 3.87 - 5.11 MIL/uL   Hemoglobin 11.9 (L) 12.0 - 15.0 g/dL   HCT 36.3 36.0 - 46.0 %   MCV 88.3 78.0 - 100.0 fL   MCH 29.0 26.0 - 34.0 pg   MCHC 32.8 30.0 - 36.0 g/dL   RDW 13.9 11.5 - 15.5 %   Platelets 257 150 - 400 K/uL  Comprehensive metabolic panel  Result Value Ref Range   Sodium 139 135 - 145 mmol/L   Potassium 4.0 3.5 - 5.1 mmol/L   Chloride 106 101 - 111 mmol/L   CO2 25 22 - 32 mmol/L   Glucose, Bld 101 (H) 65 - 99 mg/dL   BUN 9 6 - 20 mg/dL   Creatinine, Ser 0.79 0.44 - 1.00 mg/dL   Calcium 8.8 (L) 8.9 - 10.3 mg/dL   Total Protein 6.9 6.5 - 8.1 g/dL   Albumin 3.6 3.5 - 5.0 g/dL   AST 52 (H) 15 - 41 U/L   ALT 74 (H) 14 - 54 U/L   Alkaline Phosphatase 50 38 - 126 U/L   Total Bilirubin 0.8 0.3 - 1.2 mg/dL   GFR calc non Af Amer >60 >60 mL/min   GFR calc Af Amer >60 >60 mL/min   Anion gap 8 5 - 15  Lipid panel  Result Value Ref Range   Cholesterol 221 (H) 0 - 200 mg/dL   Triglycerides 232 (H) <150 mg/dL   HDL 26 (L) >40 mg/dL   Total CHOL/HDL Ratio 8.5 RATIO   VLDL 46 (H) 0 - 40 mg/dL   LDL Cholesterol 149 (H) 0 - 99 mg/dL  Hemoglobin A1c  Result Value Ref Range   Hgb A1c MFr Bld 5.6 4.8 - 5.6 %   Mean Plasma Glucose 114.02 mg/dL      Assessment & Plan:   Encounter Diagnoses  Name Primary?  . Essential hypertension Yes  . Hyperlipidemia, unspecified hyperlipidemia type   . Elevated LFTs   . Morbid obesity ([place])   . Mood disorder ([place])      -reviewed labs with pt -Add lipitor and encouraged lowfat diet and regular exercise for hyperlipidemia. -pt to continue other meds -pt to continue with MH -pt given [facility] application because she says she got a bill for her labs -pt to follow up 3 months.  RTO sooner prn

## 2017-12-14 ENCOUNTER — Encounter: Payer: Self-pay | Admitting: Physician Assistant

## 2017-12-21 ENCOUNTER — Ambulatory Visit: Payer: Self-pay | Admitting: Physician Assistant

## 2017-12-21 ENCOUNTER — Encounter: Payer: Self-pay | Admitting: Physician Assistant

## 2017-12-21 VITALS — BP 140/87 | HR 94 | Temp 97.9°F | Ht 67.75 in | Wt 332.0 lb

## 2017-12-21 DIAGNOSIS — Z79899 Other long term (current) drug therapy: Secondary | ICD-10-CM

## 2017-12-21 DIAGNOSIS — L309 Dermatitis, unspecified: Secondary | ICD-10-CM

## 2017-12-21 DIAGNOSIS — D229 Melanocytic nevi, unspecified: Secondary | ICD-10-CM

## 2017-12-21 NOTE — Progress Notes (Signed)
BP 140/87 (BP Location: Right Arm, Patient Position: Sitting, Cuff Size: Large)   Pulse 94   Temp 97.9 F (36.6 C) (Other (Comment))   Ht 5' 7.75" (1.721 m)   Wt (!) 332 lb (150.6 kg)   LMP 12/07/2017 (Approximate)   SpO2 97%   BMI 50.85 kg/m    Subjective:    Patient ID: Rita Browning, female    DOB: 1980/08/20, 37 y.o.   MRN: [id number]  HPI: Rita Browning is a 37 y.o. female presenting on 12/21/2017 for Nevus (on back)   HPI   Pt states she is concerned about a mole on her back.  She can't see it but her partner can "get a nail under it" - more noticeably than in the past  Pt asks about dry cracking skin and Bumps on R fingers.  She says she had it in the past and it went away but it came back again.   Pt c/o back pain.  She says she twisted it and just wonders if she take take IBU and flexeril (she has left from a previous time)  Relevant past medical, surgical, family and social history reviewed and updated as indicated. Interim medical history since our last visit reviewed. Allergies and medications reviewed and updated.   Current Outpatient Medications:  .  atorvastatin (LIPITOR) 10 MG tablet, Take 1 tablet (10 mg total) by mouth daily., Disp: 90 tablet, Rfl: 1 .  atorvastatin (LIPITOR) 10 MG tablet, Take 1 tablet (10 mg total) by mouth daily., Disp: 30 tablet, Rfl: 3 .  busPIRone (BUSPAR) 15 MG tablet, Take 15 mg by mouth 3 (three) times daily., Disp: , Rfl:  .  clonazePAM (KLONOPIN) 1 MG tablet, Take 0.5 mg by mouth as needed for anxiety., Disp: , Rfl:  .  hydrochlorothiazide (MICROZIDE) 12.5 MG capsule, Take 1 capsule (12.5 mg total) by mouth daily., Disp: 90 capsule, Rfl: 1 .  hydrOXYzine (ATARAX/VISTARIL) 50 MG tablet, Take 50 mg by mouth every 4 (four) hours as needed., Disp: , Rfl:  .  Oxcarbazepine (TRILEPTAL) 300 MG tablet, Take 300 mg by mouth 3 (three) times daily., Disp: , Rfl:  .  prazosin (MINIPRESS) 2 MG capsule, Take 1 capsule (2 mg total) by mouth at bedtime.  For nightmares (Patient taking differently: Take 2 mg by mouth 2 (two) times daily. For nightmares), Disp: 30 capsule, Rfl: 0 .  risperiDONE (RISPERDAL) 2 MG tablet, Take 2 mg by mouth at bedtime., Disp: , Rfl:    Review of Systems  Constitutional: Negative for appetite change, chills, diaphoresis, fatigue, fever and unexpected weight change.  HENT: Positive for dental problem. Negative for congestion, drooling, ear pain, facial swelling, hearing loss, mouth sores, sneezing, sore throat, trouble swallowing and voice change.   Eyes: Negative for pain, discharge, redness, itching and visual disturbance.  Respiratory: Negative for cough, choking, shortness of breath and wheezing.   Cardiovascular: Negative for chest pain, palpitations and leg swelling.  Gastrointestinal: Negative for abdominal pain, blood in stool, constipation, diarrhea and vomiting.  Endocrine: Negative for cold intolerance, heat intolerance and polydipsia.  Genitourinary: Negative for decreased urine volume, dysuria and hematuria.  Musculoskeletal: Positive for back pain. Negative for arthralgias and gait problem.  Skin: Negative for rash.  Allergic/Immunologic: Negative for environmental allergies.  Neurological: Negative for seizures, syncope, light-headedness and headaches.  Hematological: Negative for adenopathy.  Psychiatric/Behavioral: Negative for agitation, dysphoric mood and suicidal ideas. The patient is not nervous/anxious.     Per HPI unless specifically indicated above  Objective:    BP 140/87 (BP Location: Right Arm, Patient Position: Sitting, Cuff Size: Large)   Pulse 94   Temp 97.9 F (36.6 C) (Other (Comment))   Ht 5' 7.75" (1.721 m)   Wt (!) 332 lb (150.6 kg)   LMP 12/07/2017 (Approximate)   SpO2 97%   BMI 50.85 kg/m   Wt Readings from Last 3 Encounters:  12/21/17 (!) 332 lb (150.6 kg)  11/12/17 (!) 333 lb (151 kg)  10/15/17 (!) 334 lb 8 oz (151.7 kg)    Physical Exam  Constitutional:  She is oriented to person, place, and time. She appears well-developed and well-nourished.  Pulmonary/Chest: Effort normal. No respiratory distress.  Neurological: She is alert and oriented to person, place, and time.  Skin: Skin is warm and dry.     Small approximately 2 mm flesh colored lesion on back just below L scapula.  Lesion is dry and nontender- appears to be small cutaneous horn versus wart versus skin tag R hand with areas of dry scaly skin and a few small papules  Psychiatric: She has a normal mood and affect. Her behavior is normal.  Nursing note and vitals reviewed.       Assessment & Plan:   Encounter Diagnoses  Name Primary?  . Nevus Yes  . Eczema, unspecified type   . Polypharmacy     -will recheck back lesion at next OV and consider referral to dermatology for removal/biopsy -checked drug interactions and recommended pt use 1/2 flexeril to reduce sedation -recommended OTC cortisone cream for hand eczema  -pt to follow up October as scheduled.  RTO sooner prn

## 2018-02-11 ENCOUNTER — Ambulatory Visit: Payer: Self-pay | Admitting: Physician Assistant

## 2018-02-16 ENCOUNTER — Encounter: Payer: Self-pay | Admitting: Physician Assistant

## 2018-04-15 ENCOUNTER — Emergency Department (HOSPITAL_COMMUNITY)
Admission: EM | Admit: 2018-04-15 | Discharge: 2018-04-15 | Disposition: A | Discharge status: Home / Self Care | Payer: Self-pay | Attending: Emergency Medicine | Admitting: Emergency Medicine

## 2018-04-15 ENCOUNTER — Emergency Department (HOSPITAL_COMMUNITY): Payer: Self-pay

## 2018-04-15 ENCOUNTER — Other Ambulatory Visit: Payer: Self-pay

## 2018-04-15 ENCOUNTER — Encounter (HOSPITAL_COMMUNITY): Payer: Self-pay | Admitting: Emergency Medicine

## 2018-04-15 DIAGNOSIS — I10 Essential (primary) hypertension: Secondary | ICD-10-CM | POA: Insufficient documentation

## 2018-04-15 DIAGNOSIS — F17210 Nicotine dependence, cigarettes, uncomplicated: Secondary | ICD-10-CM | POA: Insufficient documentation

## 2018-04-15 DIAGNOSIS — Z79899 Other long term (current) drug therapy: Secondary | ICD-10-CM | POA: Insufficient documentation

## 2018-04-15 DIAGNOSIS — M5431 Sciatica, right side: Secondary | ICD-10-CM | POA: Insufficient documentation

## 2018-04-15 DIAGNOSIS — J45909 Unspecified asthma, uncomplicated: Secondary | ICD-10-CM | POA: Insufficient documentation

## 2018-04-15 DIAGNOSIS — M533 Sacrococcygeal disorders, not elsewhere classified: Secondary | ICD-10-CM | POA: Insufficient documentation

## 2018-04-15 LAB — POC URINE PREG, ED: Preg Test, Ur: NEGATIVE

## 2018-04-15 MED ORDER — HYDROCODONE-ACETAMINOPHEN 5-325 MG PO TABS: 1.0000 | ORAL_TABLET | ORAL | 0 refills | Status: DC | PRN

## 2018-04-15 MED ORDER — HYDROCODONE-ACETAMINOPHEN 5-325 MG PO TABS
1.0000 | ORAL_TABLET | Freq: Once | ORAL | Status: AC
  Administered 2018-04-15: 1 via ORAL
  Filled 2018-04-15: qty 1

## 2018-04-15 MED ORDER — OXYCODONE-ACETAMINOPHEN 5-325 MG PO TABS: 1.0000 | ORAL_TABLET | ORAL | 0 refills | Status: DC | PRN

## 2018-04-15 MED ORDER — CYCLOBENZAPRINE HCL 5 MG PO TABS: 5.0000 mg | ORAL_TABLET | Freq: Three times a day (TID) | ORAL | 0 refills | Status: DC | PRN

## 2018-04-15 MED ORDER — ONDANSETRON 4 MG PO TBDP
4.0000 mg | ORAL_TABLET | Freq: Once | ORAL | Status: AC
  Administered 2018-04-15: 4 mg via ORAL
  Filled 2018-04-15: qty 1

## 2018-04-15 MED ORDER — IBUPROFEN 600 MG PO TABS: 600.0000 mg | ORAL_TABLET | Freq: Four times a day (QID) | ORAL | 0 refills | Status: DC | PRN

## 2018-04-15 NOTE — Discharge Instructions (Signed)
Use the medicines as prescribed in addition to continued heat therapy as you have done. Your xrays are negative for bony injury or arthritis changes. You will need a recheck if your symptoms are not improving with todays treatment plan.  You may take the hydrocodone prescribed for pain relief.  This will make you drowsy - do not drive within 4 hours of taking this medication.

## 2018-04-15 NOTE — ED Notes (Signed)
POC urine preg negative 

## 2018-04-15 NOTE — ED Notes (Signed)
Pt complaining of right lower back pain that involves her buttocks, right hip, and states pain turns into a tingling sensation as well

## 2018-04-15 NOTE — ED Notes (Signed)
Advised patient we needed urine specimen. 

## 2018-04-15 NOTE — ED Triage Notes (Signed)
Patient complains of lower back pain that started 2-3 weeks ago. States 3-4 days ago pain moved to right hip and leg making it difficult to walk. Patient has tried heat, ibuprofen and rest with no relief.

## 2018-04-15 NOTE — ED Provider Notes (Signed)
[facility] EMERGENCY DEPARTMENT Provider Note   CSN: [id number] Arrival date & time: 04/15/18  6433     History   Chief Complaint Chief Complaint  Patient presents with  . Back Pain    HPI Rita Browning is a 37 y.o. female with a past medical history as outlined below, most significant for history of intermittent episodes of sciatica presenting with a one-month history of "tailbone" pain.  She denies any recent ill injuries but does recall a suspected fracture to her coccyx years ago.  She was simply getting out of bed one morning when she noted a deep pain at her coccyx region which has become increasingly severe over the past 4 weeks.  She has tried warm soaks, ibuprofen, topical pain medications without relief.  She denies swelling, redness, changes in her skin at the site.  4 days ago she developed more chronic sciatica type symptoms on her right side.  She describes a burning pain starting in her right paralumbar region radiating through her right buttock and into her right lateral thigh.  This symptom is more consistent with her chronic sciatica pain.  She denies any rash at the site but she does describe a [place], burning, needlelike pain which is more intense than her typical sciatica pain.  She has had no weakness or numbness in her lower extremities, denies fevers or chills, no new injuries, no urinary or fecal incontinence or retention.  No rash at the site.  Pain is worse with positional changes and weightbearing.  She was walking this morning when she felt a severe popping sensation at her right hip which has intensified this pain.  The history is provided by the patient.    Past Medical History:  Diagnosis Date  . Anemia   . Anxiety   . Asthma   . Bipolar disorder ([place])   . Blood clot in vein right leg  . Borderline personality disorder ([place])   . Depression   . GERD (gastroesophageal reflux disease)   . Hyperlipidemia   . Hypertension   . PTSD (post-traumatic stress  disorder)   . PTSD (post-traumatic stress disorder)     Patient Active Problem List   Diagnosis Date Noted  . Essential hypertension 11/12/2017  . Hyperlipidemia 11/12/2017  . MDD (major depressive disorder), recurrent severe, without psychosis ([place]) 10/22/2017  . Bipolar II disorder ([place]) 06/09/2017    Past Surgical History:  Procedure Laterality Date  . blood clot removed from right leg    . CHOLECYSTECTOMY    . ORIF ANKLE FRACTURE Right      OB History   No obstetric history on file.      Home Medications    Prior to Admission medications   Medication Sig Start Date End Date Taking? Authorizing Provider  atorvastatin (LIPITOR) 10 MG tablet Take 1 tablet (10 mg total) by mouth daily. 11/12/17  Yes Soyla Dryer, PA-C  busPIRone (BUSPAR) 15 MG tablet Take 15 mg by mouth 4 (four) times daily.    Yes [provider]  clonazePAM (KLONOPIN) 1 MG tablet Take 0.5 mg by mouth as needed for anxiety.   Yes [provider]  hydrochlorothiazide (MICROZIDE) 12.5 MG capsule Take 1 capsule (12.5 mg total) by mouth daily. 10/15/17  Yes Soyla Dryer, PA-C  hydrOXYzine (ATARAX/VISTARIL) 50 MG tablet Take 50 mg by mouth at bedtime as needed.    Yes [provider]  Oxcarbazepine (TRILEPTAL) 300 MG tablet Take 600 mg by mouth 3 (three) times daily.  Yes [provider]  prazosin (MINIPRESS) 2 MG capsule Take 1 capsule (2 mg total) by mouth at bedtime. For nightmares Patient taking differently: Take 2 mg by mouth daily. For nightmares 06/12/17  Yes Money, Lowry Ram, FNP  risperiDONE (RISPERDAL) 2 MG tablet Take 2 mg by mouth at bedtime.   Yes [provider]  atorvastatin (LIPITOR) 10 MG tablet Take 1 tablet (10 mg total) by mouth daily. 11/12/17   Soyla Dryer, PA-C  cyclobenzaprine (FLEXERIL) 5 MG tablet Take 1 tablet (5 mg total) by mouth 3 (three) times daily as needed for muscle spasms. 04/15/18   Evalee Jefferson, PA-C  ibuprofen  (ADVIL,MOTRIN) 600 MG tablet Take 1 tablet (600 mg total) by mouth every 6 (six) hours as needed. 04/15/18   Evalee Jefferson, PA-C  oxyCODONE-acetaminophen (PERCOCET/ROXICET) 5-325 MG tablet Take 1 tablet by mouth every 4 (four) hours as needed. 04/15/18   Evalee Jefferson, PA-C    Family History Family History  Problem Relation Age of Onset  . Diabetes Mother   . Hypertension Mother   . Cancer Mother        cervical  . Kidney disease Mother   . Neuropathy Mother   . Diabetes Father   . Hypertension Father   . Heart disease Father        enlarge heart  . Cancer Sister        cervical  . Anxiety disorder Sister   . Seizures Maternal Grandmother   . Hypertension Paternal Grandmother   . Stroke Paternal Grandmother   . Heart disease Paternal Grandmother   . Heart attack Paternal Grandmother     Social History Social History   Tobacco Use  . Smoking status: Current Some Day Smoker    Types: E-cigarettes, Cigarettes  . Smokeless tobacco: Never Used  Substance Use Topics  . Alcohol use: Yes    Frequency: Never    Comment: 1-2 drinks/months  . Drug use: No     Allergies   Codeine   Review of Systems Review of Systems  Constitutional: Negative for chills and fever.  HENT: Negative.   Respiratory: Negative for shortness of breath.   Cardiovascular: Negative for chest pain and leg swelling.  Gastrointestinal: Negative for abdominal pain, nausea and vomiting.  Genitourinary: Negative for difficulty urinating, dysuria, flank pain, frequency and urgency.  Musculoskeletal: Positive for arthralgias and back pain. Negative for gait problem, joint swelling and myalgias.  Skin: Negative for color change, rash and wound.  Neurological: Negative for weakness and numbness.     Physical Exam Updated Vital Signs BP (!) 145/101 (BP Location: Left Arm)   Pulse 85   Temp 98.3 F (36.8 C) (Oral)   Resp 15   Ht 5\' 8"  (1.727 m)   Wt (!) 155.1 kg   LMP 03/16/2018 Comment: neg preg test    SpO2 99%   BMI 52.00 kg/m   Physical Exam Vitals signs and nursing note reviewed.  Constitutional:      Appearance: She is well-developed.  HENT:     Head: Normocephalic.  Eyes:     Conjunctiva/sclera: Conjunctivae normal.  Neck:     Musculoskeletal: Normal range of motion and neck supple.  Cardiovascular:     Rate and Rhythm: Normal rate.     Comments: Pedal pulses normal. Pulmonary:     Effort: Pulmonary effort is normal.  Abdominal:     General: Bowel sounds are normal. There is no distension.     Palpations: Abdomen is soft. There  is no mass.  Musculoskeletal: Normal range of motion.     Lumbar back: She exhibits tenderness. She exhibits no swelling, no edema and no spasm.     Comments: ttp coccyx.  No induration or erythema.  No skin lesions or rash.  Skin:    General: Skin is warm and dry.  Neurological:     Mental Status: She is alert.     Sensory: No sensory deficit.     Motor: No tremor or atrophy.     Gait: Gait normal.     Deep Tendon Reflexes:     Reflex Scores:      Patellar reflexes are 2+ on the right side and 2+ on the left side.      Achilles reflexes are 2+ on the right side and 2+ on the left side.    Comments: No strength deficit noted in hip and knee flexor and extensor muscle groups.  Ankle flexion and extension intact.      ED Treatments / Results  Labs (all labs ordered are listed, but only abnormal results are displayed) Labs Reviewed  POC URINE PREG, ED    EKG None  Radiology Dg Sacrum/coccyx  Result Date: 04/15/2018 CLINICAL DATA:  Coccygeal pain for 1 month.  No known injury. EXAM: SACRUM AND COCCYX - 2+ VIEW COMPARISON:  None. FINDINGS: There is no evidence of fracture or other focal bone lesions. IMPRESSION: Negative exam. Electronically Signed   By: Inge Rise M.D.   On: 04/15/2018 11:41   Dg Hips Bilat W Or Wo Pelvis 2 Views  Result Date: 04/15/2018 CLINICAL DATA:  Right sciatic pain for 3-4 days.  No known injury.  EXAM: DG HIP (WITH OR WITHOUT PELVIS) 2V BILAT COMPARISON:  None. FINDINGS: There is no evidence of hip fracture or dislocation. There is no evidence of arthropathy or other focal bone abnormality. IMPRESSION: Negative exam. Electronically Signed   By: Inge Rise M.D.   On: 04/15/2018 11:46    Procedures Procedures (including critical care time)  Medications Ordered in ED Medications  HYDROcodone-acetaminophen (NORCO/VICODIN) 5-325 MG per tablet 1 tablet (1 tablet Oral Given 04/15/18 1048)  ondansetron (ZOFRAN-ODT) disintegrating tablet 4 mg (4 mg Oral Given 04/15/18 1049)     Initial Impression / Assessment and Plan / ED Course  I have reviewed the triage vital signs and the nursing notes.  Pertinent labs & imaging results that were available during my care of the patient were reviewed by me and considered in my medical decision making (see chart for details).     Pt with reproducible pain at the coccygeal region, no skin changes, induration or rash suggesting pilonidal abscess.  No rash along the right radicular lumbar region to suggest shingles. She was placed on pain medicine, flexeril, ibu. Encouraged continued heat, donut pillow for control of coccydynia pain.  Plan f/u with pcp if sx persist/worsen.  Return precautions outlined.  Final Clinical Impressions(s) / ED Diagnoses   Final diagnoses:  Coccydynia  Sciatica of right side    ED Discharge Orders         Ordered    HYDROcodone-acetaminophen (NORCO/VICODIN) 5-325 MG tablet  Every 4 hours PRN,   Status:  Discontinued     04/15/18 1208    cyclobenzaprine (FLEXERIL) 5 MG tablet  3 times daily PRN     04/15/18 1208    ibuprofen (ADVIL,MOTRIN) 600 MG tablet  Every 6 hours PRN     04/15/18 1210    oxyCODONE-acetaminophen (PERCOCET/ROXICET) 5-325 MG  tablet  Every 4 hours PRN     04/15/18 1224           Evalee Jefferson, Hershal Coria 04/15/18 1228    Nat Christen, MD 04/17/18 [phone]

## 2018-06-10 ENCOUNTER — Ambulatory Visit: Payer: Self-pay | Admitting: Physician Assistant

## 2018-06-10 ENCOUNTER — Encounter: Payer: Self-pay | Admitting: Physician Assistant

## 2018-06-10 VITALS — BP 125/81 | HR 109 | Temp 98.4°F

## 2018-06-10 DIAGNOSIS — I10 Essential (primary) hypertension: Secondary | ICD-10-CM

## 2018-06-10 DIAGNOSIS — J209 Acute bronchitis, unspecified: Secondary | ICD-10-CM

## 2018-06-10 DIAGNOSIS — E785 Hyperlipidemia, unspecified: Secondary | ICD-10-CM

## 2018-06-10 DIAGNOSIS — R062 Wheezing: Secondary | ICD-10-CM

## 2018-06-10 MED ORDER — PREDNISONE 20 MG PO TABS: 20.0000 mg | ORAL_TABLET | Freq: Two times a day (BID) | ORAL | 0 refills | Status: DC

## 2018-06-10 MED ORDER — AMOXICILLIN 500 MG PO CAPS: 500.0000 mg | ORAL_CAPSULE | Freq: Three times a day (TID) | ORAL | 0 refills | Status: DC

## 2018-06-10 MED ORDER — ALBUTEROL SULFATE HFA 108 (90 BASE) MCG/ACT IN AERS: 2.0000 | INHALATION_SPRAY | Freq: Four times a day (QID) | RESPIRATORY_TRACT | 0 refills | Status: DC | PRN

## 2018-06-10 MED ORDER — PROMETHAZINE HCL 25 MG PO TABS: 25.0000 mg | ORAL_TABLET | Freq: Three times a day (TID) | ORAL | 0 refills | Status: DC | PRN

## 2018-06-10 MED ORDER — ALBUTEROL SULFATE (2.5 MG/3ML) 0.083% IN NEBU
2.5000 mg | INHALATION_SOLUTION | Freq: Once | RESPIRATORY_TRACT | Status: AC
  Administered 2018-06-10: 2.5 mg via RESPIRATORY_TRACT

## 2018-06-10 NOTE — Progress Notes (Signed)
BP 125/81   Pulse (!) 109   Temp 98.4 F (36.9 C)   SpO2 98%    Subjective:    Patient ID: Rita Browning, female    DOB: 10/18/1980, 38 y.o.   MRN: [id number]  HPI: Rita Browning is a 38 y.o. female presenting on 06/10/2018 for No chief complaint on file.   HPI   Pt started feeling bad Tuesday.  She is having nausea.  No emesis.  Some 4 or 5 episodes diarrhea - last episode about 30 minutes ago.   Also little bit of cough but not much.  No ear pain.  + sore throat.    Pt has not smoked in several days.   She is still going to [facility] for [facility] care  Pt is past due for routine appointment.  She was a no-show to her appointment in October.  Relevant past medical, surgical, family and social history reviewed and updated as indicated. Interim medical history since our last visit reviewed. Allergies and medications reviewed and updated.   Current Outpatient Medications:  .  atorvastatin (LIPITOR) 10 MG tablet, Take 1 tablet (10 mg total) by mouth daily., Disp: 90 tablet, Rfl: 1 .  busPIRone (BUSPAR) 15 MG tablet, Take 15 mg by mouth 4 (four) times daily. , Disp: , Rfl:  .  clonazePAM (KLONOPIN) 1 MG tablet, Take 0.5 mg by mouth as needed for anxiety., Disp: , Rfl:  .  hydrOXYzine (ATARAX/VISTARIL) 50 MG tablet, Take 50 mg by mouth at bedtime as needed. , Disp: , Rfl:  .  Oxcarbazepine (TRILEPTAL) 300 MG tablet, Take 600 mg by mouth 3 (three) times daily. , Disp: , Rfl:  .  prazosin (MINIPRESS) 2 MG capsule, Take 1 capsule (2 mg total) by mouth at bedtime. For nightmares (Patient taking differently: Take 2 mg by mouth daily. For nightmares), Disp: 30 capsule, Rfl: 0 .  risperiDONE (RISPERDAL) 2 MG tablet, Take 2 mg by mouth at bedtime., Disp: , Rfl:  .  cyclobenzaprine (FLEXERIL) 5 MG tablet, Take 1 tablet (5 mg total) by mouth 3 (three) times daily as needed for muscle spasms. (Patient not taking: Reported on 06/10/2018), Disp: 15 tablet, Rfl: 0 .  hydrochlorothiazide (MICROZIDE) 12.5 MG  capsule, Take 1 capsule (12.5 mg total) by mouth daily. (Patient not taking: Reported on 06/10/2018), Disp: 90 capsule, Rfl: 1 .  ibuprofen (ADVIL,MOTRIN) 600 MG tablet, Take 1 tablet (600 mg total) by mouth every 6 (six) hours as needed. (Patient not taking: Reported on 06/10/2018), Disp: 30 tablet, Rfl: 0 .  oxyCODONE-acetaminophen (PERCOCET/ROXICET) 5-325 MG tablet, Take 1 tablet by mouth every 4 (four) hours as needed. (Patient not taking: Reported on 06/10/2018), Disp: 20 tablet, Rfl: 0   Review of Systems  Constitutional: Positive for appetite change, chills, diaphoresis and fatigue. Negative for fever and unexpected weight change.  HENT: Positive for congestion and sore throat. Negative for dental problem, drooling, ear pain, facial swelling, hearing loss, mouth sores, sneezing, trouble swallowing and voice change.   Eyes: Negative for pain, discharge, redness, itching and visual disturbance.  Respiratory: Positive for cough, chest tightness and shortness of breath. Negative for choking and wheezing.   Cardiovascular: Positive for palpitations. Negative for chest pain and leg swelling.  Gastrointestinal: Positive for diarrhea. Negative for abdominal pain, blood in stool, constipation and vomiting.  Endocrine: Negative for cold intolerance, heat intolerance and polydipsia.  Genitourinary: Negative for decreased urine volume, dysuria and hematuria.  Musculoskeletal: Negative for arthralgias, back pain and gait problem.  Skin: Negative for rash.  Allergic/Immunologic: Negative for environmental allergies.  Neurological: Positive for light-headedness and headaches. Negative for seizures and syncope.  Hematological: Negative for adenopathy.  Psychiatric/Behavioral: Negative for agitation, dysphoric mood and suicidal ideas. The patient is nervous/anxious.     Per HPI unless specifically indicated above     Objective:    BP 125/81   Pulse (!) 109   Temp 98.4 F (36.9 C)   SpO2 98%   Wt  Readings from Last 3 Encounters:  04/15/18 (!) 342 lb (155.1 kg)  12/21/17 (!) 332 lb (150.6 kg)  11/12/17 (!) 333 lb (151 kg)    Physical Exam Vitals signs reviewed.  Constitutional:      Appearance: She is well-developed.  HENT:     Head: Normocephalic and atraumatic.     Right Ear: Hearing, tympanic membrane, ear canal and external ear normal.     Left Ear: Hearing, tympanic membrane, ear canal and external ear normal.     Nose: Nose normal.     Mouth/Throat:     Pharynx: Uvula midline. No oropharyngeal exudate.  Neck:     Musculoskeletal: Neck supple.  Cardiovascular:     Rate and Rhythm: Normal rate and regular rhythm.  Pulmonary:     Effort: Pulmonary effort is normal.     Breath sounds: Normal breath sounds. No wheezing.  Abdominal:     General: Bowel sounds are normal.     Palpations: Abdomen is soft. There is no mass.     Tenderness: There is no abdominal tenderness.  Lymphadenopathy:     Cervical: No cervical adenopathy.  Skin:    General: Skin is warm and dry.  Neurological:     Mental Status: She is alert and oriented to person, place, and time.  Psychiatric:        Behavior: Behavior normal.         Assessment & Plan:    Encounter Diagnoses  Name Primary?  . Acute bronchitis, unspecified organism Yes  . Wheezing   . Essential hypertension   . Hyperlipidemia, unspecified hyperlipidemia type     -rx Phenergan, amoxil, albuterol -OTC imodium,  -rest.  Push fluids -counseled to avoid smoking -pt to follow up 2 weeks for routine care.  Pt to RTO sooner prn worsening or new symptoms or if not improving

## 2018-06-10 NOTE — Patient Instructions (Signed)
Diarrhea, Adult  Diarrhea is frequent loose and watery bowel movements. Diarrhea can make you feel weak and cause you to become dehydrated. Dehydration can make you tired and thirsty, cause you to have a dry mouth, and decrease how often you urinate.  Diarrhea typically lasts 2-3 days. However, it can last longer if it is a sign of something more serious. It is important to treat your diarrhea as told by your health care provider.  Follow these instructions at home:  Eating and drinking         Follow these recommendations as told by your health care provider:  · Take an oral rehydration solution (ORS). This is an over-the-counter medicine that helps return your body to its normal balance of nutrients and water. It is found at pharmacies and retail stores.  · Drink plenty of fluids, such as water, ice chips, diluted fruit juice, and low-calorie sports drinks. You can drink milk also, if desired.  · Avoid drinking fluids that contain a lot of sugar or caffeine, such as energy drinks, sports drinks, and soda.  · Eat bland, easy-to-digest foods in small amounts as you are able. These foods include bananas, applesauce, rice, lean meats, toast, and crackers.  · Avoid alcohol.  · Avoid spicy or fatty foods.    Medicines  · Take over-the-counter and prescription medicines only as told by your health care provider.  · If you were prescribed an antibiotic medicine, take it as told by your health care provider. Do not stop using the antibiotic even if you start to feel better.  General instructions    · Wash your hands often using soap and water. If soap and water are not available, use a hand sanitizer. Others in the household should wash their hands as well. Hands should be washed:  ? After using the toilet or changing a diaper.  ? Before preparing, cooking, or serving food.  ? While caring for a sick person or while visiting someone in a hospital.  · Drink enough fluid to keep your urine pale yellow.  · Rest at home while  you recover.  · Watch your condition for any changes.  · Take a warm bath to relieve any burning or pain from frequent diarrhea episodes.  · Keep all follow-up visits as told by your health care provider. This is important.  Contact a health care provider if:  · You have a fever.  · Your diarrhea gets worse.  · You have new symptoms.  · You cannot keep fluids down.  · You feel light-headed or dizzy.  · You have a headache.  · You have muscle cramps.  Get help right away if:  · You have chest pain.  · You feel extremely weak or you faint.  · You have bloody or black stools or stools that look like tar.  · You have severe pain, cramping, or bloating in your abdomen.  · You have trouble breathing or you are breathing very quickly.  · Your heart is beating very quickly.  · Your skin feels cold and clammy.  · You feel confused.  · You have signs of dehydration, such as:  ? Dark urine, very little urine, or no urine.  ? Cracked lips.  ? Dry mouth.  ? Sunken eyes.  ? Sleepiness.  ? Weakness.  Summary  · Diarrhea is frequent loose and watery bowel movements. Diarrhea can make you feel weak and cause you to become dehydrated.  · Drink enough fluids   to keep your urine pale yellow.  · Make sure that you wash your hands after using the toilet. If soap and water are not available, use hand sanitizer.  · Contact a health care provider if your diarrhea gets worse or you have new symptoms.  · Get help right away if you have signs of dehydration.  This information is not intended to replace advice given to you by your health care provider. Make sure you discuss any questions you have with your health care provider.  Document Released: 04/04/2002 Document Revised: 09/18/2017 Document Reviewed: 09/18/2017  Elsevier Interactive Patient Education © 2019 [employer]

## 2018-06-22 ENCOUNTER — Encounter: Payer: Self-pay | Admitting: Physician Assistant

## 2018-06-22 ENCOUNTER — Ambulatory Visit: Payer: Self-pay | Admitting: Physician Assistant

## 2018-06-22 ENCOUNTER — Other Ambulatory Visit (HOSPITAL_COMMUNITY)
Admission: RE | Admit: 2018-06-22 | Discharge: 2018-06-22 | Disposition: A | Discharge status: Home / Self Care | Payer: Self-pay | Source: Ambulatory Visit | Attending: Physician Assistant | Admitting: Physician Assistant

## 2018-06-22 VITALS — BP 140/87 | HR 98 | Temp 98.1°F | Ht 67.75 in | Wt 337.0 lb

## 2018-06-22 DIAGNOSIS — E785 Hyperlipidemia, unspecified: Secondary | ICD-10-CM | POA: Insufficient documentation

## 2018-06-22 DIAGNOSIS — F17200 Nicotine dependence, unspecified, uncomplicated: Secondary | ICD-10-CM

## 2018-06-22 DIAGNOSIS — F39 Unspecified mood [affective] disorder: Secondary | ICD-10-CM

## 2018-06-22 DIAGNOSIS — I10 Essential (primary) hypertension: Secondary | ICD-10-CM | POA: Insufficient documentation

## 2018-06-22 DIAGNOSIS — T148XXA Other injury of unspecified body region, initial encounter: Secondary | ICD-10-CM

## 2018-06-22 LAB — COMPREHENSIVE METABOLIC PANEL
ALT: 40 U/L (ref 0–44)
AST: 28 U/L (ref 15–41)
Albumin: 3.7 g/dL (ref 3.5–5.0)
Alkaline Phosphatase: 58 U/L (ref 38–126)
Anion gap: 8 (ref 5–15)
BUN: 11 mg/dL (ref 6–20)
CO2: 22 mmol/L (ref 22–32)
Calcium: 8.9 mg/dL (ref 8.9–10.3)
Chloride: 109 mmol/L (ref 98–111)
Creatinine, Ser: 0.73 mg/dL (ref 0.44–1.00)
GFR calc Af Amer: >60 mL/min (ref >60)
Glucose, Bld: 111 mg/dL — ABNORMAL HIGH (ref 70–99)
Potassium: 3.6 mmol/L (ref 3.5–5.1)
Sodium: 139 mmol/L (ref 135–145)
Total Bilirubin: 0.2 mg/dL — ABNORMAL LOW (ref 0.3–1.2)
Total Protein: 7.2 g/dL (ref 6.5–8.1)

## 2018-06-22 LAB — LIPID PANEL
Cholesterol: 226 mg/dL — ABNORMAL HIGH (ref 0–200)
HDL: 31 mg/dL — ABNORMAL LOW (ref >40)
LDL Cholesterol: 168 mg/dL — ABNORMAL HIGH (ref 0–99)
Total CHOL/HDL Ratio: 7.3 RATIO
Triglycerides: 135 mg/dL (ref <150)
VLDL: 27 mg/dL (ref 0–40)

## 2018-06-22 MED ORDER — ATORVASTATIN CALCIUM 10 MG PO TABS: 10.0000 mg | ORAL_TABLET | Freq: Every day | ORAL | 4 refills | Status: DC

## 2018-06-22 MED ORDER — HYDROCHLOROTHIAZIDE 12.5 MG PO CAPS: 12.5000 mg | ORAL_CAPSULE | Freq: Every day | ORAL | 4 refills | Status: DC

## 2018-06-22 NOTE — Progress Notes (Signed)
BP 140/87 (BP Location: Right Arm, Patient Position: Sitting, Cuff Size: Large)   Pulse 98   Temp 98.1 F (36.7 C)   Ht 5' 7.75" (1.721 m)   Wt (!) 337 lb (152.9 kg)   SpO2 98%   BMI 51.62 kg/m    Subjective:    Patient ID: Rita Browning, female    DOB: [birth year], 38 y.o.   MRN: [id number]  HPI: Rita Browning is a 38 y.o. female presenting on 06/22/2018 for Hypertension and Hyperlipidemia   HPI   Pt was on hctz and atorvastatin in the past but she didn't get them refilled.   Pt is past due for routine appointment.  She is still going to [facility] for [facility] issues.   She has intterview at [employer] tomorrow  Pt continues to smoke  Pt curious about a spot on her R breast that showed up about a week ago.  It doesn't hurt.   Relevant past medical, surgical, family and social history reviewed and updated as indicated. Interim medical history since our last visit reviewed. Allergies and medications reviewed and updated.   Current Outpatient Medications:  .  albuterol (PROVENTIL HFA;VENTOLIN HFA) 108 (90 Base) MCG/ACT inhaler, Inhale 2 puffs into the lungs every 6 (six) hours as needed for wheezing or shortness of breath., Disp: 1 Inhaler, Rfl: 0 .  busPIRone (BUSPAR) 15 MG tablet, Take 15 mg by mouth 4 (four) times daily. , Disp: , Rfl:  .  clonazePAM (KLONOPIN) 1 MG tablet, Take 0.5 mg by mouth as needed for anxiety., Disp: , Rfl:  .  cyclobenzaprine (FLEXERIL) 5 MG tablet, Take 1 tablet (5 mg total) by mouth 3 (three) times daily as needed for muscle spasms., Disp: 15 tablet, Rfl: 0 .  hydrOXYzine (ATARAX/VISTARIL) 50 MG tablet, Take 50 mg by mouth at bedtime as needed. , Disp: , Rfl:  .  ibuprofen (ADVIL,MOTRIN) 200 MG tablet, Take 800 mg by mouth every 6 (six) hours as needed., Disp: , Rfl:  .  Oxcarbazepine (TRILEPTAL) 300 MG tablet, Take 600 mg by mouth 3 (three) times daily. , Disp: , Rfl:  .  prazosin (MINIPRESS) 2 MG capsule, Take 1 capsule (2 mg total) by mouth at bedtime.  For nightmares (Patient taking differently: Take 2 mg by mouth daily. For nightmares), Disp: 30 capsule, Rfl: 0 .  risperiDONE (RISPERDAL) 2 MG tablet, Take 2 mg by mouth at bedtime., Disp: , Rfl:     Review of Systems  Constitutional: Positive for appetite change. Negative for chills, diaphoresis, fatigue, fever and unexpected weight change.  HENT: Negative for congestion, dental problem, drooling, ear pain, facial swelling, hearing loss, mouth sores, sneezing, sore throat, trouble swallowing and voice change.   Eyes: Negative for pain, discharge, redness, itching and visual disturbance.  Respiratory: Positive for shortness of breath. Negative for cough, choking and wheezing.   Cardiovascular: Positive for palpitations. Negative for chest pain and leg swelling.  Gastrointestinal: Negative for abdominal pain, blood in stool, constipation, diarrhea and vomiting.  Endocrine: Negative for cold intolerance, heat intolerance and polydipsia.  Genitourinary: Negative for decreased urine volume, dysuria and hematuria.  Musculoskeletal: Positive for back pain. Negative for arthralgias and gait problem.  Skin: Negative for rash.  Allergic/Immunologic: Negative for environmental allergies.  Neurological: Positive for headaches. Negative for seizures, syncope and light-headedness.  Hematological: Negative for adenopathy.  Psychiatric/Behavioral: Positive for dysphoric mood. Negative for agitation and suicidal ideas. The patient is nervous/anxious.     Per HPI unless specifically indicated  above     Objective:    BP 140/87 (BP Location: Right Arm, Patient Position: Sitting, Cuff Size: Large)   Pulse 98   Temp 98.1 F (36.7 C)   Ht 5' 7.75" (1.721 m)   Wt (!) 337 lb (152.9 kg)   SpO2 98%   BMI 51.62 kg/m   Wt Readings from Last 3 Encounters:  06/22/18 (!) 337 lb (152.9 kg)  04/15/18 (!) 342 lb (155.1 kg)  12/21/17 (!) 332 lb (150.6 kg)    Physical Exam Vitals signs reviewed.   Constitutional:      Appearance: She is well-developed.  HENT:     Head: Normocephalic and atraumatic.  Neck:     Musculoskeletal: Neck supple.  Cardiovascular:     Rate and Rhythm: Normal rate and regular rhythm.  Pulmonary:     Effort: Pulmonary effort is normal.     Breath sounds: Normal breath sounds.  Abdominal:     General: Bowel sounds are normal.     Palpations: Abdomen is soft. There is no mass.     Tenderness: There is no abdominal tenderness.  Lymphadenopathy:     Cervical: No cervical adenopathy.  Skin:    General: Skin is warm and dry.     Comments: 1/2 cm reddish spot R breast - flat, nontender, no palpable mass, no dimpling  Neurological:     Mental Status: She is alert and oriented to person, place, and time.  Psychiatric:        Behavior: Behavior normal.     Results for orders placed or performed during the hospital encounter of 06/22/18  Lipid panel  Result Value Ref Range   Cholesterol 226 (H) 0 - 200 mg/dL   Triglycerides 135 <150 mg/dL   HDL 31 (L) >40 mg/dL   Total CHOL/HDL Ratio 7.3 RATIO   VLDL 27 0 - 40 mg/dL   LDL Cholesterol 168 (H) 0 - 99 mg/dL  Comprehensive metabolic panel  Result Value Ref Range   Sodium 139 135 - 145 mmol/L   Potassium 3.6 3.5 - 5.1 mmol/L   Chloride 109 98 - 111 mmol/L   CO2 22 22 - 32 mmol/L   Glucose, Bld 111 (H) 70 - 99 mg/dL   BUN 11 6 - 20 mg/dL   Creatinine, Ser 0.73 0.44 - 1.00 mg/dL   Calcium 8.9 8.9 - 10.3 mg/dL   Total Protein 7.2 6.5 - 8.1 g/dL   Albumin 3.7 3.5 - 5.0 g/dL   AST 28 15 - 41 U/L   ALT 40 0 - 44 U/L   Alkaline Phosphatase 58 38 - 126 U/L   Total Bilirubin 0.2 (L) 0.3 - 1.2 mg/dL   GFR calc non Af Amer >60 >60 mL/min   GFR calc Af Amer >60 >60 mL/min   Anion gap 8 5 - 15      Assessment & Plan:   Encounter Diagnoses  Name Primary?  . Essential hypertension Yes  . Hyperlipidemia, unspecified hyperlipidemia type   . Mood disorder ([place])   . Tobacco use disorder   . Abrasion   .  Morbid obesity ([place])     -Reviewed labs with pt -Restart hctz and atorvastatin -discussed with pt that breast spot looks to be little bruise or abrasion.  Will recheck 1 month -pt to follow up 1 month to recheck bp and spot on breast

## 2018-06-23 ENCOUNTER — Ambulatory Visit: Payer: Self-pay | Admitting: Physician Assistant

## 2018-07-21 ENCOUNTER — Encounter: Payer: Self-pay | Admitting: Physician Assistant

## 2018-07-21 ENCOUNTER — Ambulatory Visit: Payer: Self-pay | Admitting: Physician Assistant

## 2018-07-21 ENCOUNTER — Other Ambulatory Visit: Payer: Self-pay

## 2018-07-21 VITALS — BP 132/86 | HR 98 | Temp 97.7°F

## 2018-07-21 DIAGNOSIS — I10 Essential (primary) hypertension: Secondary | ICD-10-CM

## 2018-07-21 NOTE — Progress Notes (Signed)
BP 132/86   Pulse 98   Temp 97.7 F (36.5 C)   SpO2 98%    Subjective:    Patient ID: Rita Browning, female    DOB: [birth year], 38 y.o.   MRN: [id number]  HPI: Rita Browning is a 38 y.o. female presenting on 07/21/2018 for Hypertension and Breast Mass (pt states spot when away)   HPI  Pt says she is doing well and is having no cough or fever.  She says the spot on her breast went away completely.  She has no complaints today  Relevant past medical, surgical, family and social history reviewed and updated as indicated. Interim medical history since our last visit reviewed. Allergies and medications reviewed and updated.   Current Outpatient Medications:  .  albuterol (PROVENTIL HFA;VENTOLIN HFA) 108 (90 Base) MCG/ACT inhaler, Inhale 2 puffs into the lungs every 6 (six) hours as needed for wheezing or shortness of breath., Disp: 1 Inhaler, Rfl: 0 .  atorvastatin (LIPITOR) 10 MG tablet, Take 1 tablet (10 mg total) by mouth daily., Disp: 30 tablet, Rfl: 4 .  busPIRone (BUSPAR) 15 MG tablet, Take 15 mg by mouth 4 (four) times daily. , Disp: , Rfl:  .  clonazePAM (KLONOPIN) 1 MG tablet, Take 0.5 mg by mouth as needed for anxiety., Disp: , Rfl:  .  cyclobenzaprine (FLEXERIL) 5 MG tablet, Take 1 tablet (5 mg total) by mouth 3 (three) times daily as needed for muscle spasms., Disp: 15 tablet, Rfl: 0 .  hydrochlorothiazide (MICROZIDE) 12.5 MG capsule, Take 1 capsule (12.5 mg total) by mouth daily., Disp: 30 capsule, Rfl: 4 .  hydrOXYzine (ATARAX/VISTARIL) 50 MG tablet, Take 50 mg by mouth at bedtime as needed. , Disp: , Rfl:  .  ibuprofen (ADVIL,MOTRIN) 200 MG tablet, Take 800 mg by mouth every 6 (six) hours as needed., Disp: , Rfl:  .  Oxcarbazepine (TRILEPTAL) 300 MG tablet, Take 600 mg by mouth 3 (three) times daily. , Disp: , Rfl:  .  prazosin (MINIPRESS) 2 MG capsule, Take 1 capsule (2 mg total) by mouth at bedtime. For nightmares (Patient taking differently: Take 2 mg by mouth daily. For  nightmares), Disp: 30 capsule, Rfl: 0 .  risperiDONE (RISPERDAL) 2 MG tablet, Take 2 mg by mouth at bedtime., Disp: , Rfl:     Review of Systems  Per HPI unless specifically indicated above     Objective:    BP 132/86   Pulse 98   Temp 97.7 F (36.5 C)   SpO2 98%   Wt Readings from Last 3 Encounters:  06/22/18 (!) 337 lb (152.9 kg)  04/15/18 (!) 342 lb (155.1 kg)  12/21/17 (!) 332 lb (150.6 kg)    Physical Exam Vitals signs reviewed.  Constitutional:      Appearance: She is well-developed.  HENT:     Head: Normocephalic and atraumatic.  Neck:     Musculoskeletal: Neck supple.  Cardiovascular:     Rate and Rhythm: Normal rate and regular rhythm.  Pulmonary:     Effort: Pulmonary effort is normal.     Breath sounds: Normal breath sounds.  Abdominal:     General: Bowel sounds are normal.     Palpations: Abdomen is soft. There is no mass.     Tenderness: There is no abdominal tenderness.  Lymphadenopathy:     Cervical: No cervical adenopathy.  Skin:    General: Skin is warm and dry.  Neurological:     Mental Status: She is alert and  oriented to person, place, and time.  Psychiatric:        Behavior: Behavior normal.     Results for orders placed or performed during the hospital encounter of 06/22/18  Lipid panel  Result Value Ref Range   Cholesterol 226 (H) 0 - 200 mg/dL   Triglycerides 135 <150 mg/dL   HDL 31 (L) >40 mg/dL   Total CHOL/HDL Ratio 7.3 RATIO   VLDL 27 0 - 40 mg/dL   LDL Cholesterol 168 (H) 0 - 99 mg/dL  Comprehensive metabolic panel  Result Value Ref Range   Sodium 139 135 - 145 mmol/L   Potassium 3.6 3.5 - 5.1 mmol/L   Chloride 109 98 - 111 mmol/L   CO2 22 22 - 32 mmol/L   Glucose, Bld 111 (H) 70 - 99 mg/dL   BUN 11 6 - 20 mg/dL   Creatinine, Ser 0.73 0.44 - 1.00 mg/dL   Calcium 8.9 8.9 - 10.3 mg/dL   Total Protein 7.2 6.5 - 8.1 g/dL   Albumin 3.7 3.5 - 5.0 g/dL   AST 28 15 - 41 U/L   ALT 40 0 - 44 U/L   Alkaline Phosphatase 58 38 -  126 U/L   Total Bilirubin 0.2 (L) 0.3 - 1.2 mg/dL   GFR calc non Af Amer >60 >60 mL/min   GFR calc Af Amer >60 >60 mL/min   Anion gap 8 5 - 15      Assessment & Plan:   Encounter Diagnosis  Name Primary?  . Essential hypertension Yes     -pt to Continue current medications -No labs now due to CV19 -pt to Follow up 3 months.  She is to call office sooner prn

## 2018-09-06 ENCOUNTER — Encounter: Payer: Self-pay | Admitting: Physician Assistant

## 2018-09-06 ENCOUNTER — Ambulatory Visit: Payer: Self-pay | Admitting: Physician Assistant

## 2018-09-06 DIAGNOSIS — K0889 Other specified disorders of teeth and supporting structures: Secondary | ICD-10-CM

## 2018-09-06 MED ORDER — AMOXICILLIN 500 MG PO CAPS: 500.0000 mg | ORAL_CAPSULE | Freq: Three times a day (TID) | ORAL | 0 refills | Status: DC

## 2018-09-06 NOTE — Progress Notes (Signed)
There were no vitals taken for this visit.   Subjective:    Patient ID: Rita Browning, female    DOB: 05-Mar-1981, 38 y.o.   MRN: [id number]  HPI: Rita Browning is a 38 y.o. female presenting on 09/06/2018 for No chief complaint on file.   HPI   This is a telemedicine visit through [employer] due to coronavirus pandemic.  I connected with  Valeska Haislip on 09/06/18 by a video enabled telemedicine application and verified that I am speaking with the correct person using two identifiers.   I discussed the limitations of evaluation and management by telemedicine. The patient expressed understanding and agreed to proceed.    Pt complains of sore tooth with some localized swelling around that tooth.  She had had these in the past and know she needs antibiotics before the dentist will treat her.  Pt states pain, no fevers.   Relevant past medical, surgical, family and social history reviewed and updated as indicated. Interim medical history since our last visit reviewed. Allergies and medications reviewed and updated.   Current Outpatient Medications:  .  albuterol (PROVENTIL HFA;VENTOLIN HFA) 108 (90 Base) MCG/ACT inhaler, Inhale 2 puffs into the lungs every 6 (six) hours as needed for wheezing or shortness of breath., Disp: 1 Inhaler, Rfl: 0 .  atorvastatin (LIPITOR) 10 MG tablet, Take 1 tablet (10 mg total) by mouth daily., Disp: 30 tablet, Rfl: 4 .  busPIRone (BUSPAR) 15 MG tablet, Take 15 mg by mouth 4 (four) times daily. , Disp: , Rfl:  .  clonazePAM (KLONOPIN) 1 MG tablet, Take 0.5 mg by mouth as needed for anxiety., Disp: , Rfl:  .  hydrochlorothiazide (MICROZIDE) 12.5 MG capsule, Take 1 capsule (12.5 mg total) by mouth daily., Disp: 30 capsule, Rfl: 4 .  hydrOXYzine (ATARAX/VISTARIL) 50 MG tablet, Take 50 mg by mouth at bedtime as needed. , Disp: , Rfl:  .  ibuprofen (ADVIL,MOTRIN) 200 MG tablet, Take 800 mg by mouth every 6 (six) hours as needed., Disp: , Rfl:  .  Oxcarbazepine (TRILEPTAL) 300  MG tablet, Take 600 mg by mouth 3 (three) times daily. , Disp: , Rfl:  .  prazosin (MINIPRESS) 2 MG capsule, Take 1 capsule (2 mg total) by mouth at bedtime. For nightmares (Patient taking differently: Take 2 mg by mouth daily. For nightmares), Disp: 30 capsule, Rfl: 0 .  risperiDONE (RISPERDAL) 2 MG tablet, Take 2 mg by mouth at bedtime., Disp: , Rfl:  .  cyclobenzaprine (FLEXERIL) 5 MG tablet, Take 1 tablet (5 mg total) by mouth 3 (three) times daily as needed for muscle spasms. (Patient not taking: Reported on 09/06/2018), Disp: 15 tablet, Rfl: 0   Review of Systems  Per HPI unless specifically indicated above     Objective:    There were no vitals taken for this visit.  Wt Readings from Last 3 Encounters:  06/22/18 (!) 337 lb (152.9 kg)  04/15/18 (!) 342 lb (155.1 kg)  12/21/17 (!) 332 lb (150.6 kg)    Physical Exam Constitutional:      Appearance: She is obese.  HENT:     Head: Normocephalic and atraumatic.  Pulmonary:     Effort: Pulmonary effort is normal. No respiratory distress.  Neurological:     Mental Status: She is alert and oriented to person, place, and time.  Psychiatric:        Attention and Perception: Attention normal.        Mood and Affect: Mood normal.  Speech: Speech normal.        Behavior: Behavior is cooperative.           Assessment & Plan:    Encounter Diagnosis  Name Primary?  Paschal Dopp Yes    -rx amoxil -pt will be pt on Dental list -pt to Follow up June as scheduled.  Pt to contact office sooner prn

## 2018-10-07 ENCOUNTER — Other Ambulatory Visit: Payer: Self-pay | Admitting: Physician Assistant

## 2018-10-07 DIAGNOSIS — I10 Essential (primary) hypertension: Secondary | ICD-10-CM

## 2018-10-07 DIAGNOSIS — E785 Hyperlipidemia, unspecified: Secondary | ICD-10-CM

## 2018-10-19 ENCOUNTER — Other Ambulatory Visit (HOSPITAL_COMMUNITY)
Admission: RE | Admit: 2018-10-19 | Discharge: 2018-10-19 | Disposition: A | Discharge status: Home / Self Care | Payer: Self-pay | Source: Ambulatory Visit | Attending: Physician Assistant | Admitting: Physician Assistant

## 2018-10-19 DIAGNOSIS — I10 Essential (primary) hypertension: Secondary | ICD-10-CM

## 2018-10-19 DIAGNOSIS — E785 Hyperlipidemia, unspecified: Secondary | ICD-10-CM

## 2018-10-19 LAB — LIPID PANEL
Cholesterol: 156 mg/dL (ref 0–200)
HDL: 31 mg/dL — ABNORMAL LOW (ref >40)
LDL Cholesterol: 97 mg/dL (ref 0–99)
Total CHOL/HDL Ratio: 5 RATIO
Triglycerides: 138 mg/dL (ref <150)
VLDL: 28 mg/dL (ref 0–40)

## 2018-10-19 LAB — COMPREHENSIVE METABOLIC PANEL
ALT: 24 U/L (ref 0–44)
AST: 20 U/L (ref 15–41)
Albumin: 3.8 g/dL (ref 3.5–5.0)
Alkaline Phosphatase: 56 U/L (ref 38–126)
Anion gap: 8 (ref 5–15)
BUN: 13 mg/dL (ref 6–20)
CO2: 26 mmol/L (ref 22–32)
Calcium: 9 mg/dL (ref 8.9–10.3)
Chloride: 105 mmol/L (ref 98–111)
Creatinine, Ser: 0.72 mg/dL (ref 0.44–1.00)
GFR calc Af Amer: >60 mL/min (ref >60)
GFR calc non Af Amer: >60 mL/min (ref >60)
Glucose, Bld: 103 mg/dL — ABNORMAL HIGH (ref 70–99)
Potassium: 4.2 mmol/L (ref 3.5–5.1)
Sodium: 139 mmol/L (ref 135–145)
Total Bilirubin: 0.5 mg/dL (ref 0.3–1.2)
Total Protein: 7.2 g/dL (ref 6.5–8.1)

## 2018-10-20 ENCOUNTER — Encounter: Payer: Self-pay | Admitting: Physician Assistant

## 2018-10-20 ENCOUNTER — Ambulatory Visit: Payer: Self-pay | Admitting: Physician Assistant

## 2018-10-20 DIAGNOSIS — I10 Essential (primary) hypertension: Secondary | ICD-10-CM

## 2018-10-20 DIAGNOSIS — F39 Unspecified mood [affective] disorder: Secondary | ICD-10-CM

## 2018-10-20 DIAGNOSIS — E669 Obesity, unspecified: Secondary | ICD-10-CM

## 2018-10-20 DIAGNOSIS — E785 Hyperlipidemia, unspecified: Secondary | ICD-10-CM

## 2018-10-20 MED ORDER — ATORVASTATIN CALCIUM 10 MG PO TABS: 10.0000 mg | ORAL_TABLET | Freq: Every day | ORAL | 4 refills | Status: DC

## 2018-10-20 MED ORDER — ALBUTEROL SULFATE HFA 108 (90 BASE) MCG/ACT IN AERS: 2.0000 | INHALATION_SPRAY | Freq: Four times a day (QID) | RESPIRATORY_TRACT | 3 refills | Status: DC | PRN

## 2018-10-20 NOTE — Progress Notes (Signed)
There were no vitals taken for this visit.   Subjective:    Patient ID: Rita Browning, female    DOB: 1980-06-27, 38 y.o.   MRN: [id number]  HPI: Rita Browning is a 38 y.o. female presenting on 10/20/2018 for No chief complaint on file.   HPI     This is a telemedicine visit through [employer] due to coronavirus pandemic  I connected with  Rita Browning on 10/20/18 by a video enabled telemedicine application and verified that I am speaking with the correct person using two identifiers.   I discussed the limitations of evaluation and management by telemedicine. The patient expressed understanding and agreed to proceed.   Pt is in a parked car somewhere/unknown location.  Provider is at office  [facility]  She has been checking her bp at home recently.  bp at home lately in the 130's  Pt is Still not smoking!  She says she is doing well and has no complaints.     Relevant past medical, surgical, family and social history reviewed and updated as indicated. Interim medical history since our last visit reviewed. Allergies and medications reviewed and updated.   Current Outpatient Medications:  .  atorvastatin (LIPITOR) 10 MG tablet, Take 1 tablet (10 mg total) by mouth daily., Disp: 30 tablet, Rfl: 4 .  busPIRone (BUSPAR) 15 MG tablet, Take 15 mg by mouth 4 (four) times daily. , Disp: , Rfl:  .  clonazePAM (KLONOPIN) 1 MG tablet, Take 0.5 mg by mouth as needed for anxiety., Disp: , Rfl:  .  hydrochlorothiazide (MICROZIDE) 12.5 MG capsule, Take 1 capsule (12.5 mg total) by mouth daily., Disp: 30 capsule, Rfl: 4 .  hydrOXYzine (ATARAX/VISTARIL) 50 MG tablet, Take 50 mg by mouth at bedtime as needed. , Disp: , Rfl:  .  ibuprofen (ADVIL,MOTRIN) 200 MG tablet, Take 800 mg by mouth every 6 (six) hours as needed., Disp: , Rfl:  .  Oxcarbazepine (TRILEPTAL) 300 MG tablet, Take 600 mg by mouth 3 (three) times daily. , Disp: , Rfl:  .  prazosin (MINIPRESS) 2 MG  capsule, Take 1 capsule (2 mg total) by mouth at bedtime. For nightmares (Patient taking differently: Take 2 mg by mouth daily. For nightmares), Disp: 30 capsule, Rfl: 0 .  risperiDONE (RISPERDAL) 2 MG tablet, Take 2 mg by mouth at bedtime., Disp: , Rfl:  .  albuterol (PROVENTIL HFA;VENTOLIN HFA) 108 (90 Base) MCG/ACT inhaler, Inhale 2 puffs into the lungs every 6 (six) hours as needed for wheezing or shortness of breath. (Patient not taking: Reported on 10/20/2018), Disp: 1 Inhaler, Rfl: 0    Review of Systems  Per HPI unless specifically indicated above     Objective:    There were no vitals taken for this visit.  Wt Readings from Last 3 Encounters:  06/22/18 (!) 337 lb (152.9 kg)  04/15/18 (!) 342 lb (155.1 kg)  12/21/17 (!) 332 lb (150.6 kg)    Physical Exam Constitutional:      General: She is not in acute distress.    Appearance: Normal appearance. She is obese. She is not ill-appearing.  HENT:     Head: Normocephalic and atraumatic.  Pulmonary:     Effort: Pulmonary effort is normal. No respiratory distress.  Neurological:     Mental Status: She is alert and oriented to person, place, and time.  Psychiatric:        Attention and Perception: Attention normal.  Mood and Affect: Mood normal.        Speech: Speech normal.        Behavior: Behavior normal. Behavior is cooperative.        Cognition and Memory: Cognition normal.     Results for orders placed or performed during the hospital encounter of 10/19/18  Lipid panel  Result Value Ref Range   Cholesterol 156 0 - 200 mg/dL   Triglycerides 138 <150 mg/dL   HDL 31 (L) >40 mg/dL   Total CHOL/HDL Ratio 5.0 RATIO   VLDL 28 0 - 40 mg/dL   LDL Cholesterol 97 0 - 99 mg/dL  Comprehensive metabolic panel  Result Value Ref Range   Sodium 139 135 - 145 mmol/L   Potassium 4.2 3.5 - 5.1 mmol/L   Chloride 105 98 - 111 mmol/L   CO2 26 22 - 32 mmol/L   Glucose, Bld 103 (H) 70 - 99 mg/dL   BUN 13 6 - 20 mg/dL    Creatinine, Ser 0.72 0.44 - 1.00 mg/dL   Calcium 9.0 8.9 - 10.3 mg/dL   Total Protein 7.2 6.5 - 8.1 g/dL   Albumin 3.8 3.5 - 5.0 g/dL   AST 20 15 - 41 U/L   ALT 24 0 - 44 U/L   Alkaline Phosphatase 56 38 - 126 U/L   Total Bilirubin 0.5 0.3 - 1.2 mg/dL   GFR calc non Af Amer >60 >60 mL/min   GFR calc Af Amer >60 >60 mL/min   Anion gap 8 5 - 15      Assessment & Plan:    Encounter Diagnoses  Name Primary?  . Essential hypertension Yes  . Hyperlipidemia, unspecified hyperlipidemia type   . Mood disorder ([place])   . Obesity, unspecified classification, unspecified obesity type, unspecified whether serious comorbidity present      -Reviewed labs with pt -pt to Continue current medications and healthy lifestyle -[facility] -pt to follow up 3 months.  She is to contact office sooner prn

## 2018-11-16 ENCOUNTER — Telehealth: Payer: Self-pay | Admitting: Student

## 2018-11-16 ENCOUNTER — Encounter (HOSPITAL_COMMUNITY): Payer: Self-pay | Admitting: Emergency Medicine

## 2018-11-16 ENCOUNTER — Other Ambulatory Visit: Payer: Self-pay

## 2018-11-16 ENCOUNTER — Emergency Department (HOSPITAL_COMMUNITY)
Admission: EM | Admit: 2018-11-16 | Discharge: 2018-11-16 | Discharge status: Against Medical Advice | Payer: Self-pay | Attending: Emergency Medicine | Admitting: Emergency Medicine

## 2018-11-16 DIAGNOSIS — Z5321 Procedure and treatment not carried out due to patient leaving prior to being seen by health care provider: Secondary | ICD-10-CM | POA: Insufficient documentation

## 2018-11-16 NOTE — Telephone Encounter (Signed)
Pt called c/o "bad HA" and a couple nose bleeds. Pt states she thinks her BP is up and wants to come by the office to get it checked.  Pt states HA began this morning and she has had 2 nosebleeds this morning. Pt states she woke up at 4AM and it is now 8:30AM. Pt states she is not out of her HTN medication and has been taking it as prescribed. Pt states she has been stressed lately. Pt states she has experienced these sx in the past and it has been due to her BP being high.  LPN explained to patient that PA recommends she go to the ER in light of her sx. Pt states "she can't go there"; when asked why she cannot got to ER pt responded that she needs to care for her mother and needs to pick her up from dialysis around 8:40AM - 9AM and that she "just doesn't want to be at the ER right now". LPN explained to pt that it is very important to make sure she does not end up with a stroke. Pt verbalized understanding and states she is going to "work some things out first", pick up her mother and if not any better she will go to ER. LPN once again encouraged pt to go to ER. Pt verbalized understanding.

## 2018-11-16 NOTE — ED Triage Notes (Signed)
C/o headache, rates pain 8/10.  Had 2 nose bleeds, pt says she has sinus pressure.  NIHSS negative.

## 2018-11-16 NOTE — ED Triage Notes (Signed)
Registration informed that pt left at 1123.

## 2019-01-24 ENCOUNTER — Other Ambulatory Visit (HOSPITAL_COMMUNITY)
Admission: RE | Admit: 2019-01-24 | Discharge: 2019-01-24 | Disposition: A | Discharge status: Home / Self Care | Payer: Self-pay | Source: Ambulatory Visit | Attending: Physician Assistant | Admitting: Physician Assistant

## 2019-01-24 DIAGNOSIS — I10 Essential (primary) hypertension: Secondary | ICD-10-CM | POA: Insufficient documentation

## 2019-01-24 DIAGNOSIS — E785 Hyperlipidemia, unspecified: Secondary | ICD-10-CM | POA: Insufficient documentation

## 2019-01-24 LAB — COMPREHENSIVE METABOLIC PANEL
ALT: 21 U/L (ref 0–44)
AST: 19 U/L (ref 15–41)
Albumin: 3.7 g/dL (ref 3.5–5.0)
Alkaline Phosphatase: 61 U/L (ref 38–126)
Anion gap: 9 (ref 5–15)
BUN: 8 mg/dL (ref 6–20)
CO2: 26 mmol/L (ref 22–32)
Calcium: 9 mg/dL (ref 8.9–10.3)
Chloride: 105 mmol/L (ref 98–111)
Creatinine, Ser: 0.89 mg/dL (ref 0.44–1.00)
GFR calc Af Amer: >60 mL/min (ref >60)
GFR calc non Af Amer: >60 mL/min (ref >60)
Glucose, Bld: 114 mg/dL — ABNORMAL HIGH (ref 70–99)
Potassium: 4 mmol/L (ref 3.5–5.1)
Sodium: 140 mmol/L (ref 135–145)
Total Bilirubin: 0.6 mg/dL (ref 0.3–1.2)
Total Protein: 7.2 g/dL (ref 6.5–8.1)

## 2019-01-24 LAB — LIPID PANEL
Cholesterol: 160 mg/dL (ref 0–200)
HDL: 31 mg/dL — ABNORMAL LOW (ref >40)
LDL Cholesterol: 96 mg/dL (ref 0–99)
Total CHOL/HDL Ratio: 5.2 RATIO
Triglycerides: 167 mg/dL — ABNORMAL HIGH (ref <150)
VLDL: 33 mg/dL (ref 0–40)

## 2019-01-26 ENCOUNTER — Ambulatory Visit: Payer: Self-pay | Admitting: Physician Assistant

## 2019-01-26 ENCOUNTER — Encounter: Payer: Self-pay | Admitting: Physician Assistant

## 2019-01-26 DIAGNOSIS — I10 Essential (primary) hypertension: Secondary | ICD-10-CM

## 2019-01-26 DIAGNOSIS — E785 Hyperlipidemia, unspecified: Secondary | ICD-10-CM

## 2019-01-26 DIAGNOSIS — F39 Unspecified mood [affective] disorder: Secondary | ICD-10-CM

## 2019-01-26 DIAGNOSIS — E669 Obesity, unspecified: Secondary | ICD-10-CM

## 2019-01-26 MED ORDER — ALBUTEROL SULFATE HFA 108 (90 BASE) MCG/ACT IN AERS: 2.0000 | INHALATION_SPRAY | Freq: Four times a day (QID) | RESPIRATORY_TRACT | 3 refills | Status: AC | PRN

## 2019-01-26 NOTE — Progress Notes (Signed)
There were no vitals taken for this visit.   Subjective:    Patient ID: Rita Browning, female    DOB: 08/09/1980, 38 y.o.   MRN: [id number]  HPI: Rita Browning is a 38 y.o. female presenting on 01/26/2019 for No chief complaint on file.   HPI   This is a telemedicine appointment through [employer] due to coronavirus pandemic.   I connected with  Birdine Handley on 01/26/19 by a video enabled telemedicine application and verified that I am speaking with the correct person using two identifiers.   I discussed the limitations of evaluation and management by telemedicine. The patient expressed understanding and agreed to proceed.  Pt is at home.  Provider is at office.     She is still going to [facility]   She has been doing okay.  She strained her back so that has been something.    She is having problems losing weight.    She says her blood pressure was in the 130s at [place] last week.  Her bp machine needs new batteries so she has not been checking it at home lately.   She tried to get [employer] medication assistance and was told they made $10 too much.     Relevant past medical, surgical, family and social history reviewed and updated as indicated. Interim medical history since our last visit reviewed. Allergies and medications reviewed and updated.   Current Outpatient Medications:  .  atorvastatin (LIPITOR) 10 MG tablet, Take 1 tablet (10 mg total) by mouth daily., Disp: 30 tablet, Rfl: 4 .  busPIRone (BUSPAR) 15 MG tablet, Take 15 mg by mouth 4 (four) times daily. , Disp: , Rfl:  .  clonazePAM (KLONOPIN) 1 MG tablet, Take 0.5 mg by mouth as needed for anxiety., Disp: , Rfl:  .  hydrochlorothiazide (MICROZIDE) 12.5 MG capsule, Take 1 capsule (12.5 mg total) by mouth daily., Disp: 30 capsule, Rfl: 4 .  hydrOXYzine (ATARAX/VISTARIL) 50 MG tablet, Take 50 mg by mouth at bedtime as needed. , Disp: , Rfl:  .  ibuprofen (ADVIL,MOTRIN) 200 MG tablet, Take 800 mg by mouth every 6 (six)  hours as needed., Disp: , Rfl:  .  lithium 300 MG tablet, Take 300 mg by mouth 3 (three) times daily., Disp: , Rfl:  .  Oxcarbazepine (TRILEPTAL) 300 MG tablet, Take 600 mg by mouth 3 (three) times daily. , Disp: , Rfl:  .  prazosin (MINIPRESS) 2 MG capsule, Take 1 capsule (2 mg total) by mouth at bedtime. For nightmares (Patient taking differently: Take 2 mg by mouth daily. For nightmares), Disp: 30 capsule, Rfl: 0 .  risperiDONE (RISPERDAL) 2 MG tablet, Take 2 mg by mouth at bedtime., Disp: , Rfl:  .  albuterol (VENTOLIN HFA) 108 (90 Base) MCG/ACT inhaler, Inhale 2 puffs into the lungs every 6 (six) hours as needed for wheezing or shortness of breath. (Patient not taking: Reported on 01/26/2019), Disp: 6.7 g, Rfl: 3     Review of Systems  Per HPI unless specifically indicated above     Objective:    There were no vitals taken for this visit.  Wt Readings from Last 3 Encounters:  06/22/18 (!) 337 lb (152.9 kg)  04/15/18 (!) 342 lb (155.1 kg)  12/21/17 (!) 332 lb (150.6 kg)    Physical Exam Constitutional:      General: She is not in acute distress.    Appearance: She is obese. She is not ill-appearing.  HENT:  Head: Normocephalic and atraumatic.  Pulmonary:     Effort: Pulmonary effort is normal. No respiratory distress.  Neurological:     Mental Status: She is alert and oriented to person, place, and time.  Psychiatric:        Attention and Perception: Attention normal.        Mood and Affect: Mood is depressed.        Speech: Speech normal.        Behavior: Behavior normal. Behavior is cooperative.     Results for orders placed or performed during the hospital encounter of 01/24/19  Lipid panel  Result Value Ref Range   Cholesterol 160 0 - 200 mg/dL   Triglycerides 167 (H) <150 mg/dL   HDL 31 (L) >40 mg/dL   Total CHOL/HDL Ratio 5.2 RATIO   VLDL 33 0 - 40 mg/dL   LDL Cholesterol 96 0 - 99 mg/dL  Comprehensive metabolic panel  Result Value Ref Range   Sodium  140 135 - 145 mmol/L   Potassium 4.0 3.5 - 5.1 mmol/L   Chloride 105 98 - 111 mmol/L   CO2 26 22 - 32 mmol/L   Glucose, Bld 114 (H) 70 - 99 mg/dL   BUN 8 6 - 20 mg/dL   Creatinine, Ser 0.89 0.44 - 1.00 mg/dL   Calcium 9.0 8.9 - 10.3 mg/dL   Total Protein 7.2 6.5 - 8.1 g/dL   Albumin 3.7 3.5 - 5.0 g/dL   AST 19 15 - 41 U/L   ALT 21 0 - 44 U/L   Alkaline Phosphatase 61 38 - 126 U/L   Total Bilirubin 0.6 0.3 - 1.2 mg/dL   GFR calc non Af Amer >60 >60 mL/min   GFR calc Af Amer >60 >60 mL/min   Anion gap 9 5 - 15      Assessment & Plan:   Encounter Diagnoses  Name Primary?  . Essential hypertension Yes  . Hyperlipidemia, unspecified hyperlipidemia type   . Mood disorder ([place])   . Obesity, unspecified classification, unspecified obesity type, unspecified whether serious comorbidity present      -Reviewed labs with pt   -scheduled pt for flu shot-oct 12 -pt to Continue current meds -Pt to notify office if bp runs higher than 130s -She is to continue with  for [facility] issues -pt to follow up 4 months.  She is to contact office sooner prn

## 2019-02-01 ENCOUNTER — Other Ambulatory Visit: Payer: Self-pay | Admitting: Physician Assistant

## 2019-03-15 ENCOUNTER — Encounter: Payer: Self-pay | Admitting: Nutrition

## 2019-03-15 ENCOUNTER — Encounter: Payer: Self-pay | Attending: Physician Assistant | Admitting: Nutrition

## 2019-03-15 ENCOUNTER — Other Ambulatory Visit: Payer: Self-pay

## 2019-03-15 VITALS — Ht 68.0 in | Wt 330.0 lb

## 2019-03-15 DIAGNOSIS — I10 Essential (primary) hypertension: Secondary | ICD-10-CM | POA: Insufficient documentation

## 2019-03-15 DIAGNOSIS — R739 Hyperglycemia, unspecified: Secondary | ICD-10-CM | POA: Insufficient documentation

## 2019-03-15 DIAGNOSIS — Z6841 Body Mass Index (BMI) 40.0 and over, adult: Secondary | ICD-10-CM | POA: Insufficient documentation

## 2019-03-15 DIAGNOSIS — E782 Mixed hyperlipidemia: Secondary | ICD-10-CM | POA: Insufficient documentation

## 2019-03-15 NOTE — Patient Instructions (Signed)
Goals  Follow My Plate Eat 3 meals per day Increase fresh fruits and vegetables Drink water-gallon a day Increase exercise 60 minutes Lose 1-2 lbs per week Cut out snacks Drink only water

## 2019-03-15 NOTE — Progress Notes (Signed)
  Medical Nutrition Therapy:  Appt start time: [id number] end time:  1630.   Assessment:  Primary concerns today: Obesity, HTN, Hyperlipidemia and impaired fasting glucose. Lives with her wife. Doesn't work. Treatment for depression. Cooks meals usually. Just started working out at [employer]. Admits to being a binge eater but eats meals inconsistently. Wants to lose weight and get off medications. Current diet is inconsistent to meet her needs for ongoing weight loss.    Wt Readings from Last 3 Encounters:  03/15/19 (!) 330 lb (149.7 kg)  06/22/18 (!) 337 lb (152.9 kg)  04/15/18 (!) 342 lb (155.1 kg)   Ht Readings from Last 3 Encounters:  03/15/19 5\' 8"  (1.727 m)  06/22/18 5' 7.75" (1.721 m)  04/15/18 5\' 8"  (1.727 m)   Body mass index is 50.18 kg/m. @BMIFA @ Facility age limit for growth percentiles is 20 years. Facility age limit for growth percentiles is 20 years. Lipid Panel     Component Value Date/Time   CHOL 160 01/24/2019 0847   TRIG 167 (H) 01/24/2019 0847   HDL 31 (L) 01/24/2019 0847   CHOLHDL 5.2 01/24/2019 0847   VLDL 33 01/24/2019 0847   LDLCALC 96 01/24/2019 0847   CMP Latest Ref Rng & Units 01/24/2019 10/19/2018 06/22/2018  Glucose 70 - 99 mg/dL 114(H) 103(H) 111(H)  BUN 6 - 20 mg/dL 8 13 11   Creatinine 0.44 - 1.00 mg/dL 0.89 0.72 0.73  Sodium 135 - 145 mmol/L 140 139 139  Potassium 3.5 - 5.1 mmol/L 4.0 4.2 3.6  Chloride 98 - 111 mmol/L 105 105 109  CO2 22 - 32 mmol/L 26 26 22   Calcium 8.9 - 10.3 mg/dL 9.0 9.0 8.9  Total Protein 6.5 - 8.1 g/dL 7.2 7.2 7.2  Total Bilirubin 0.3 - 1.2 mg/dL 0.6 0.5 0.2(L)  Alkaline Phos 38 - 126 U/L 61 56 58  AST 15 - 41 U/L 19 20 28   ALT 0 - 44 U/L 21 24 40   Preferred Learning Style:   No preference indicated   Learning Readiness:   Ready  Change in progress   MEDICATIONS:   DIETARY INTAKE: *.    Eats 1-3 meals per day. Drinks sodas, energy drinks and some water  Usual physical activity: started walking at  [facility].  Estimated energy needs: 1200 calories 130 g carbohydrates 90 g protein 60 g fat  Progress Towards Goal(s):  In progress.   Nutritional Diagnosis:  NB-1.1 Food and nutrition-related knowledge deficit As related to Obesity, HTN and Hyperlipidemia.  As evidenced by BMI 50, TCHOL> 200 and LDL > 100.Marland Kitchen    Intervention:  Nutrition and PRE- Diabetes education provided on My Plate, CHO counting, meal planning, portion sizes, timing of meals, avoiding snacks between meals  taking medications as prescribed, benefits of exercising 60 minutes per day and prevention of DM. Weight loss tips,     Goals  Follow My Plate Eat 3 meals per day Increase fresh fruits and vegetables Drink water-gallon a day Increase exercise 60 minutes Lose 1-2 lbs per week Cut out snacks Drink only water    Teaching Method Utilized:  Visual Auditory Hands on  Handouts given during visit include:  The Plate Method   Meal PLan Card    Barriers to learning/adherence to lifestyle change:   Demonstrated degree of understanding via:  Teach Back   Monitoring/Evaluation:  Dietary intake, exercise,  and body weight in 2 month(s).

## 2019-05-18 ENCOUNTER — Encounter: Payer: Self-pay | Admitting: Nutrition

## 2019-05-18 ENCOUNTER — Other Ambulatory Visit: Payer: Self-pay

## 2019-05-18 ENCOUNTER — Encounter: Payer: Self-pay | Attending: Physician Assistant | Admitting: Nutrition

## 2019-05-18 VITALS — Ht 68.0 in | Wt 335.0 lb

## 2019-05-18 DIAGNOSIS — E782 Mixed hyperlipidemia: Secondary | ICD-10-CM | POA: Insufficient documentation

## 2019-05-18 DIAGNOSIS — Z6841 Body Mass Index (BMI) 40.0 and over, adult: Secondary | ICD-10-CM | POA: Insufficient documentation

## 2019-05-18 DIAGNOSIS — R739 Hyperglycemia, unspecified: Secondary | ICD-10-CM | POA: Insufficient documentation

## 2019-05-18 DIAGNOSIS — I10 Essential (primary) hypertension: Secondary | ICD-10-CM | POA: Insufficient documentation

## 2019-05-18 NOTE — Progress Notes (Signed)
Medical Nutrition Therapy:  Appt start time: 1300 end time:  1330.   Assessment:  Primary concerns today: Obesity, HTN, Hyperlipidemia and impaired fasting glucose.  Got off track with her diet and exercise due to caring for her mom how broke her ankle and is basically total care now.  Struggles with emotional eating. Had done well planning meals at first and preparing meals . Not exercising but willing to get started going back to the gym. Gained 5 lbs. Has been eating fast food and take out more often.  Wants to get back on track.   Wt Readings from Last 3 Encounters:  03/15/19 (!) 330 lb (149.7 kg)  06/22/18 (!) 337 lb (152.9 kg)  04/15/18 (!) 342 lb (155.1 kg)   Ht Readings from Last 3 Encounters:  03/15/19 5\' 8"  (1.727 m)  06/22/18 5' 7.75" (1.721 m)  04/15/18 5\' 8"  (1.727 m)   There is no height or weight on file to calculate BMI. @BMIFA @ Facility age limit for growth percentiles is 20 years. Facility age limit for growth percentiles is 20 years. Lipid Panel     Component Value Date/Time   CHOL 160 01/24/2019 0847   TRIG 167 (H) 01/24/2019 0847   HDL 31 (L) 01/24/2019 0847   CHOLHDL 5.2 01/24/2019 0847   VLDL 33 01/24/2019 0847   LDLCALC 96 01/24/2019 0847   CMP Latest Ref Rng & Units 01/24/2019 10/19/2018 06/22/2018  Glucose 70 - 99 mg/dL 114(H) 103(H) 111(H)  BUN 6 - 20 mg/dL 8 13 11   Creatinine 0.44 - 1.00 mg/dL 0.89 0.72 0.73  Sodium 135 - 145 mmol/L 140 139 139  Potassium 3.5 - 5.1 mmol/L 4.0 4.2 3.6  Chloride 98 - 111 mmol/L 105 105 109  CO2 22 - 32 mmol/L 26 26 22   Calcium 8.9 - 10.3 mg/dL 9.0 9.0 8.9  Total Protein 6.5 - 8.1 g/dL 7.2 7.2 7.2  Total Bilirubin 0.3 - 1.2 mg/dL 0.6 0.5 0.2(L)  Alkaline Phos 38 - 126 U/L 61 56 58  AST 15 - 41 U/L 19 20 28   ALT 0 - 44 U/L 21 24 40   Preferred Learning Style:   No preference indicated   Learning Readiness:   Ready  Change in progress   MEDICATIONS:   DIETARY INTAKE: *.    Eats 1-3 meals per  day. Drinks sodas, energy drinks and some water  Usual physical activity: started walking at [facility].  Estimated energy needs: 1200 calories 130 g carbohydrates 90 g protein 60 g fat  Progress Towards Goal(s):  In progress.   Nutritional Diagnosis:  NB-1.1 Food and nutrition-related knowledge deficit As related to Obesity, HTN and Hyperlipidemia.  As evidenced by BMI 50, TCHOL> 200 and LDL > 100.Marland Kitchen    Intervention:  Nutrition and PRE- Diabetes education provided on My Plate, CHO counting, meal planning, portion sizes, timing of meals, avoiding snacks between meals  taking medications as prescribed, benefits of exercising 60 minutes per day and prevention of DM. Weight loss tips,     Goals  Follow My Plate Eat 3 meals per day Increase fresh fruits and vegetables Drink water-gallon a day Increase exercise 60 minutes Lose 1-2 lbs per week Cut out snacks Drink only water    Teaching Method Utilized:  Visual Auditory Hands on  Handouts given during visit include:  The Plate Method   Meal PLan Card    Barriers to learning/adherence to lifestyle change:   Demonstrated degree of understanding via:  Teach Back  Monitoring/Evaluation:  Dietary intake, exercise,  and body weight in 2 month(s).

## 2019-05-18 NOTE — Patient Instructions (Addendum)
Goals  Drink a gallon of water. Cut out sodas, sugar and junk food Increase fresh fruitsa and vegetables. Plan and organize meals Work out at gym 3 times per week Keep food journal No snacks between meals or after dinner except veggies. Lose 1-2 lbs per week. Weigh on Wednesdays only. Work with therapist

## 2019-05-20 ENCOUNTER — Other Ambulatory Visit (HOSPITAL_COMMUNITY)
Admission: RE | Admit: 2019-05-20 | Discharge: 2019-05-20 | Disposition: A | Discharge status: Home / Self Care | Payer: Self-pay | Source: Ambulatory Visit | Attending: Physician Assistant | Admitting: Physician Assistant

## 2019-05-20 DIAGNOSIS — I10 Essential (primary) hypertension: Secondary | ICD-10-CM | POA: Insufficient documentation

## 2019-05-20 DIAGNOSIS — E785 Hyperlipidemia, unspecified: Secondary | ICD-10-CM | POA: Insufficient documentation

## 2019-05-20 LAB — COMPREHENSIVE METABOLIC PANEL
ALT: 22 U/L (ref 0–44)
AST: 20 U/L (ref 15–41)
Albumin: 3.9 g/dL (ref 3.5–5.0)
Alkaline Phosphatase: 56 U/L (ref 38–126)
Anion gap: 11 (ref 5–15)
BUN: 9 mg/dL (ref 6–20)
CO2: 23 mmol/L (ref 22–32)
Calcium: 9.1 mg/dL (ref 8.9–10.3)
Chloride: 104 mmol/L (ref 98–111)
Creatinine, Ser: 0.8 mg/dL (ref 0.44–1.00)
GFR calc Af Amer: >60 mL/min (ref >60)
GFR calc non Af Amer: >60 mL/min (ref >60)
Glucose, Bld: 106 mg/dL — ABNORMAL HIGH (ref 70–99)
Potassium: 3.9 mmol/L (ref 3.5–5.1)
Sodium: 138 mmol/L (ref 135–145)
Total Bilirubin: 0.6 mg/dL (ref 0.3–1.2)
Total Protein: 7.4 g/dL (ref 6.5–8.1)

## 2019-05-20 LAB — LIPID PANEL
Cholesterol: 220 mg/dL — ABNORMAL HIGH (ref 0–200)
HDL: 29 mg/dL — ABNORMAL LOW (ref >40)
LDL Cholesterol: 147 mg/dL — ABNORMAL HIGH (ref 0–99)
Total CHOL/HDL Ratio: 7.6 RATIO
Triglycerides: 221 mg/dL — ABNORMAL HIGH (ref <150)
VLDL: 44 mg/dL — ABNORMAL HIGH (ref 0–40)

## 2019-05-23 ENCOUNTER — Ambulatory Visit: Payer: Self-pay | Admitting: Physician Assistant

## 2019-05-23 ENCOUNTER — Other Ambulatory Visit: Payer: Self-pay

## 2019-05-23 ENCOUNTER — Encounter: Payer: Self-pay | Admitting: Physician Assistant

## 2019-05-23 VITALS — BP 152/88 | HR 92 | Temp 97.3°F | Wt 338.0 lb

## 2019-05-23 DIAGNOSIS — E785 Hyperlipidemia, unspecified: Secondary | ICD-10-CM

## 2019-05-23 DIAGNOSIS — Z79899 Other long term (current) drug therapy: Secondary | ICD-10-CM

## 2019-05-23 DIAGNOSIS — F39 Unspecified mood [affective] disorder: Secondary | ICD-10-CM

## 2019-05-23 DIAGNOSIS — I10 Essential (primary) hypertension: Secondary | ICD-10-CM

## 2019-05-23 MED ORDER — LISINOPRIL 10 MG PO TABS: 10.0000 mg | ORAL_TABLET | Freq: Every day | ORAL | 3 refills | Status: DC

## 2019-05-23 NOTE — Progress Notes (Signed)
BP (!) 152/88   Pulse 92   Temp (!) 97.3 F (36.3 C)   Wt (!) 338 lb (153.3 kg)   SpO2 99%   BMI 51.39 kg/m    Subjective:    Patient ID: Rita Browning, female    DOB: December 18, 1980, 39 y.o.   MRN: [id number]  HPI: Rita Browning is a 39 y.o. female presenting on 05/23/2019 for No chief complaint on file.   HPI   Pt had a negative covid 33 screening questionnaire   Pt is a very pleasant 83yoF with htn, dyslipidemia and some mental health issues.    She says she is Doing okay.   She is not working currently.  She has a lot of stress associated with her mother who lives with her (recent surgery, ongoing dialysis, etc).   Pt is still going to [facility] for [facility] issues.   Her lithium was increased since her last appointment here.   Pt  Monitors her bp at home.  She says it has been running high.  Pt has seen dietician and says that has been helping her.  She says her joints ache at times and she knows it is due to her weight.   She is Not having to use her inhaler often.     She is still off smoking!  It's been a year now for her.  She is currently vaping and she is hoping to stop that in the near future.      Relevant past medical, surgical, family and social history reviewed and updated as indicated. Interim medical history since our last visit reviewed. Allergies and medications reviewed and updated.   Current Outpatient Medications:  .  albuterol (VENTOLIN HFA) 108 (90 Base) MCG/ACT inhaler, Inhale 2 puffs into the lungs every 6 (six) hours as needed for wheezing or shortness of breath., Disp: 6.7 g, Rfl: 3 .  atorvastatin (LIPITOR) 10 MG tablet, Take 1 tablet (10 mg total) by mouth daily., Disp: 30 tablet, Rfl: 4 .  busPIRone (BUSPAR) 15 MG tablet, Take 15 mg by mouth 4 (four) times daily. , Disp: , Rfl:  .  clonazePAM (KLONOPIN) 1 MG tablet, Take 0.5 mg by mouth as needed for anxiety., Disp: , Rfl:  .  hydrochlorothiazide (MICROZIDE) 12.5 MG capsule, Take 1 capsule (12.5 mg total) by  mouth daily., Disp: 30 capsule, Rfl: 4 .  hydrOXYzine (ATARAX/VISTARIL) 50 MG tablet, Take 50 mg by mouth at bedtime as needed. , Disp: , Rfl:  .  ibuprofen (ADVIL,MOTRIN) 200 MG tablet, Take 800 mg by mouth every 6 (six) hours as needed., Disp: , Rfl:  .  lithium 300 MG tablet, Take 300 mg by mouth 3 (three) times daily., Disp: , Rfl:  .  Oxcarbazepine (TRILEPTAL) 300 MG tablet, Take 600 mg by mouth 3 (three) times daily. , Disp: , Rfl:  .  prazosin (MINIPRESS) 2 MG capsule, Take 1 capsule (2 mg total) by mouth at bedtime. For nightmares (Patient taking differently: Take 2 mg by mouth daily. For nightmares), Disp: 30 capsule, Rfl: 0 .  risperiDONE (RISPERDAL) 2 MG tablet, Take 2 mg by mouth at bedtime., Disp: , Rfl:     Review of Systems  Per HPI unless specifically indicated above     Objective:    BP (!) 152/88   Pulse 92   Temp (!) 97.3 F (36.3 C)   Wt (!) 338 lb (153.3 kg)   SpO2 99%   BMI 51.39 kg/m   Wt Readings  from Last 3 Encounters:  05/23/19 (!) 338 lb (153.3 kg)  05/18/19 (!) 335 lb (152 kg)  03/15/19 (!) 330 lb (149.7 kg)    Physical Exam Vitals reviewed.  Constitutional:      General: She is not in acute distress.    Appearance: Normal appearance. She is well-developed. She is obese. She is not ill-appearing.  HENT:     Head: Normocephalic and atraumatic.  Cardiovascular:     Rate and Rhythm: Normal rate and regular rhythm.  Pulmonary:     Effort: Pulmonary effort is normal.     Breath sounds: Normal breath sounds.  Abdominal:     General: Bowel sounds are normal.     Palpations: Abdomen is soft. There is no mass.     Tenderness: There is no abdominal tenderness.  Musculoskeletal:     Cervical back: Neck supple.     Right lower leg: No edema.     Left lower leg: No edema.  Lymphadenopathy:     Cervical: No cervical adenopathy.  Skin:    General: Skin is warm and dry.  Neurological:     Mental Status: She is alert and oriented to person, place, and  time.     Cranial Nerves: No facial asymmetry.     Motor: No tremor.     Gait: Gait is intact.     Comments: Grips strong and equal bilaterally  Psychiatric:        Attention and Perception: Attention normal.        Mood and Affect: Mood normal.        Speech: Speech normal.        Behavior: Behavior normal. Behavior is cooperative.     Results for orders placed or performed during the hospital encounter of 05/20/19  Lipid panel  Result Value Ref Range   Cholesterol 220 (H) 0 - 200 mg/dL   Triglycerides 221 (H) <150 mg/dL   HDL 29 (L) >40 mg/dL   Total CHOL/HDL Ratio 7.6 RATIO   VLDL 44 (H) 0 - 40 mg/dL   LDL Cholesterol 147 (H) 0 - 99 mg/dL  Comprehensive metabolic panel  Result Value Ref Range   Sodium 138 135 - 145 mmol/L   Potassium 3.9 3.5 - 5.1 mmol/L   Chloride 104 98 - 111 mmol/L   CO2 23 22 - 32 mmol/L   Glucose, Bld 106 (H) 70 - 99 mg/dL   BUN 9 6 - 20 mg/dL   Creatinine, Ser 0.80 0.44 - 1.00 mg/dL   Calcium 9.1 8.9 - 10.3 mg/dL   Total Protein 7.4 6.5 - 8.1 g/dL   Albumin 3.9 3.5 - 5.0 g/dL   AST 20 15 - 41 U/L   ALT 22 0 - 44 U/L   Alkaline Phosphatase 56 38 - 126 U/L   Total Bilirubin 0.6 0.3 - 1.2 mg/dL   GFR calc non Af Amer >60 >60 mL/min   GFR calc Af Amer >60 >60 mL/min   Anion gap 11 5 - 15      Assessment & Plan:     1. htn Reviewed labs with pt.  Pt Will continue hctz and Add  lisinorpil   -   Pt is to continue to monitor her bp and she is couneled to notify office if it is running higher than today's reading  2. Dyslipidemia Discussed increasing lipitor and she wants to continue current dosage.  She is going to get back to taking it regularly and is also working  on healthier diet and regular exercise  3. MH issues Continue with [facility]  4. Obesity Pt encouraged to Contine efforts for healthy diet and exercise  5. HCM PAP up-to-date  Pt with follow up with virtual appointment in 1 month to recheck BP.  She is to contact office  sooner prn

## 2019-05-24 ENCOUNTER — Other Ambulatory Visit: Payer: Self-pay | Admitting: Physician Assistant

## 2019-05-24 MED ORDER — METOPROLOL TARTRATE 50 MG PO TABS: 50.0000 mg | ORAL_TABLET | Freq: Two times a day (BID) | ORAL | 1 refills | Status: DC

## 2019-06-22 ENCOUNTER — Encounter: Payer: Self-pay | Admitting: Physician Assistant

## 2019-06-22 ENCOUNTER — Ambulatory Visit: Payer: Self-pay | Admitting: Physician Assistant

## 2019-06-22 VITALS — BP 148/97 | Wt 330.0 lb

## 2019-06-22 DIAGNOSIS — Z79899 Other long term (current) drug therapy: Secondary | ICD-10-CM

## 2019-06-22 DIAGNOSIS — E785 Hyperlipidemia, unspecified: Secondary | ICD-10-CM

## 2019-06-22 DIAGNOSIS — E669 Obesity, unspecified: Secondary | ICD-10-CM

## 2019-06-22 DIAGNOSIS — I10 Essential (primary) hypertension: Secondary | ICD-10-CM

## 2019-06-22 DIAGNOSIS — F17200 Nicotine dependence, unspecified, uncomplicated: Secondary | ICD-10-CM

## 2019-06-22 DIAGNOSIS — F39 Unspecified mood [affective] disorder: Secondary | ICD-10-CM

## 2019-06-22 MED ORDER — METOPROLOL TARTRATE 50 MG PO TABS: 50.0000 mg | ORAL_TABLET | Freq: Two times a day (BID) | ORAL | 1 refills | Status: DC

## 2019-06-22 NOTE — Progress Notes (Signed)
BP (!) 148/97   Wt (!) 330 lb (149.7 kg)   BMI 50.18 kg/m    Subjective:    Patient ID: Rita Browning, female    DOB: Sep 11, 1980, 39 y.o.   MRN: [id number]  HPI: Rita Browning is a 39 y.o. female presenting on 06/22/2019 for No chief complaint on file.   HPI  This is a telemedicine appointment through [employer] due to coronavirus pandemic.  I connected with  Rita Browning on 06/22/19 by a video enabled telemedicine application and verified that I am speaking with the correct person using two identifiers.   I discussed the limitations of evaluation and management by telemedicine. The patient expressed understanding and agreed to proceed.  Pt is at home.  Provider is working from home office.   Pt is 75yoF with htn, dysliidemia RAD and mental health issues.  Her appointment today is to follow up on HTN.  She monitors her bp at home.  She says lately it has Been running 120's to 116--  She never got the metorpolol that was prescribed.  She says her bp 151 at psychiatrist the other day but pt says she had forgotten her rx that day.   Back pain flared up today due to moving her mother recently.     Relevant past medical, surgical, family and social history reviewed and updated as indicated. Interim medical history since our last visit reviewed. Allergies and medications reviewed and updated.   Current Outpatient Medications:  .  albuterol (VENTOLIN HFA) 108 (90 Base) MCG/ACT inhaler, Inhale 2 puffs into the lungs every 6 (six) hours as needed for wheezing or shortness of breath., Disp: 6.7 g, Rfl: 3 .  atorvastatin (LIPITOR) 10 MG tablet, Take 1 tablet (10 mg total) by mouth daily., Disp: 30 tablet, Rfl: 4 .  busPIRone (BUSPAR) 15 MG tablet, Take 15 mg by mouth 4 (four) times daily. , Disp: , Rfl:  .  clonazePAM (KLONOPIN) 1 MG tablet, Take 0.5 mg by mouth as needed for anxiety., Disp: , Rfl:  .  hydrochlorothiazide (MICROZIDE) 12.5 MG capsule, Take 1 capsule (12.5 mg total) by mouth daily.,  Disp: 30 capsule, Rfl: 4 .  hydrOXYzine (ATARAX/VISTARIL) 50 MG tablet, Take 50 mg by mouth at bedtime as needed. , Disp: , Rfl:  .  ibuprofen (ADVIL,MOTRIN) 200 MG tablet, Take 800 mg by mouth every 6 (six) hours as needed., Disp: , Rfl:  .  lithium 300 MG tablet, Take 300 mg by mouth 4 (four) times daily., Disp: , Rfl:  .  Oxcarbazepine (TRILEPTAL) 300 MG tablet, Take 600 mg by mouth 3 (three) times daily. , Disp: , Rfl:  .  prazosin (MINIPRESS) 2 MG capsule, Take 1 capsule (2 mg total) by mouth at bedtime. For nightmares (Patient taking differently: Take 2 mg by mouth daily. For nightmares), Disp: 30 capsule, Rfl: 0 .  risperiDONE (RISPERDAL) 2 MG tablet, Take 2 mg by mouth at bedtime., Disp: , Rfl:  .  metoprolol tartrate (LOPRESSOR) 50 MG tablet, Take 1 tablet (50 mg total) by mouth 2 (two) times daily., Disp: 60 tablet, Rfl: 1    Review of Systems  Per HPI unless specifically indicated above     Objective:    BP (!) 148/97   Wt (!) 330 lb (149.7 kg)   BMI 50.18 kg/m   Wt Readings from Last 3 Encounters:  06/22/19 (!) 330 lb (149.7 kg)  05/23/19 (!) 338 lb (153.3 kg)  05/18/19 (!) 335 lb (152 kg)  Physical Exam Constitutional:      General: She is not in acute distress.    Appearance: Normal appearance. She is obese. She is not ill-appearing.  HENT:     Head: Normocephalic and atraumatic.  Pulmonary:     Effort: No respiratory distress.  Neurological:     Mental Status: She is alert and oriented to person, place, and time.  Psychiatric:        Attention and Perception: Attention normal.        Mood and Affect: Mood normal.        Speech: Speech normal.        Behavior: Behavior normal. Behavior is cooperative.            Assessment & Plan:   Encounter Diagnoses  Name Primary?  . Essential hypertension Yes  . Hyperlipidemia, unspecified hyperlipidemia type   . Mood disorder ([place])   . Polypharmacy   . Obesity, unspecified classification, unspecified  obesity type, unspecified whether serious comorbidity present   . Tobacco use disorder     Discussed with pt that she likely needs to start the metoprolol.  She is counseled If bp stays high, start metoprolol.  She is to continue to monitor the BP at home.   Pt prefers virtual appt next time.  She will f/u 2 months.  She is to contact office sooner prn

## 2019-07-13 ENCOUNTER — Other Ambulatory Visit: Payer: Self-pay

## 2019-07-13 ENCOUNTER — Encounter: Payer: Self-pay | Attending: Physician Assistant | Admitting: Nutrition

## 2019-07-13 ENCOUNTER — Encounter: Payer: Self-pay | Admitting: Nutrition

## 2019-07-13 VITALS — Ht 68.0 in | Wt 335.6 lb

## 2019-07-13 DIAGNOSIS — R739 Hyperglycemia, unspecified: Secondary | ICD-10-CM

## 2019-07-13 DIAGNOSIS — I10 Essential (primary) hypertension: Secondary | ICD-10-CM

## 2019-07-13 DIAGNOSIS — Z6841 Body Mass Index (BMI) 40.0 and over, adult: Secondary | ICD-10-CM | POA: Insufficient documentation

## 2019-07-13 DIAGNOSIS — E782 Mixed hyperlipidemia: Secondary | ICD-10-CM

## 2019-07-13 NOTE — Patient Instructions (Signed)
Goal  Keep a food journal a day Drink 1/2 gallon a day of water Stop making excuses. Reduce eating out to only 1-2 times a week. Make healthier choices when eating out Lose 1-2 lbs per week Exercise 30 minutes 4 days per week.

## 2019-07-13 NOTE — Progress Notes (Signed)
Medical Nutrition Therapy:  Appt start time: 1300 end time:  1330.   Assessment:  Primary concerns today: Obesity, HTN, Hyperlipidemia and impaired fasting glucose.  Did well with lifestyle modifications for a couople weeks. and then slipped back into some old habits with eating out a lot. Gained 5 lbs.  Is afraid of gastric bypass surgery. Got off track with her diet and exercise due to caring for her mom how broke her ankle and is basically total care now.  Struggles with emotional eating. Had done well planning meals at first and preparing meals . Not exercising but willing to get started going back to the gym.  Has been eating fast food and take out more often. Limited support at home due to family members not wanting to eat healthier. Wants to get back on track.   Wt Readings from Last 3 Encounters:  07/13/19 (!) 335 lb 9.6 oz (152.2 kg)  06/22/19 (!) 330 lb (149.7 kg)  05/23/19 (!) 338 lb (153.3 kg)   Ht Readings from Last 3 Encounters:  07/13/19 5\' 8"  (1.727 m)  05/18/19 5\' 8"  (1.727 m)  03/15/19 5\' 8"  (1.727 m)   Body mass index is 51.03 kg/m. @BMIFA @ Facility age limit for growth percentiles is 20 years. Facility age limit for growth percentiles is 20 years. Lipid Panel     Component Value Date/Time   CHOL 220 (H) 05/20/2019 0857   TRIG 221 (H) 05/20/2019 0857   HDL 29 (L) 05/20/2019 0857   CHOLHDL 7.6 05/20/2019 0857   VLDL 44 (H) 05/20/2019 0857   LDLCALC 147 (H) 05/20/2019 0857   CMP Latest Ref Rng & Units 05/20/2019 01/24/2019 10/19/2018  Glucose 70 - 99 mg/dL 106(H) 114(H) 103(H)  BUN 6 - 20 mg/dL 9 8 13   Creatinine 0.44 - 1.00 mg/dL 0.80 0.89 0.72  Sodium 135 - 145 mmol/L 138 140 139  Potassium 3.5 - 5.1 mmol/L 3.9 4.0 4.2  Chloride 98 - 111 mmol/L 104 105 105  CO2 22 - 32 mmol/L 23 26 26   Calcium 8.9 - 10.3 mg/dL 9.1 9.0 9.0  Total Protein 6.5 - 8.1 g/dL 7.4 7.2 7.2  Total Bilirubin 0.3 - 1.2 mg/dL 0.6 0.6 0.5  Alkaline Phos 38 - 126 U/L 56 61 56  AST  15 - 41 U/L 20 19 20   ALT 0 - 44 U/L 22 21 24    Preferred Learning Style:   No preference indicated   Learning Readiness:   Ready  Change in progress   MEDICATIONS:   DIETARY INTAKE:  Eats some meals at home and some fast foods.   Usual physical activity: started walking at [facility].  Estimated energy needs: 1200 calories 130 g carbohydrates 90 g protein 60 g fat  Progress Towards Goal(s):  In progress.   Nutritional Diagnosis:  NB-1.1 Food and nutrition-related knowledge deficit As related to Obesity, HTN and Hyperlipidemia.  As evidenced by BMI 50, TCHOL> 200 and LDL > 100.Marland Kitchen    Intervention:  Nutrition and PRE- Diabetes education provided on My Plate, CHO counting, meal planning, portion sizes, timing of meals, avoiding snacks between meals  taking medications as prescribed, benefits of exercising 60 minutes per day and prevention of DM. Weight loss tips,     Goal  Keep a food journal a day Drink 1/2 gallon a day of water Stop making excuses. Reduce eating out to only 1-2 times a week. Make healthier choices when eating out Lose 1-2 lbs per week Exercise 30 minutes 4 days per  week.   Teaching Method Utilized:  Visual Auditory Hands on  Handouts given during visit include:  The Plate Method   Meal PLan Card    Barriers to learning/adherence to lifestyle change:   Demonstrated degree of understanding via:  Teach Back   Monitoring/Evaluation:  Dietary intake, exercise,  and body weight in 2 month(s). She would benefit from Ozmepic or victoza for weight loss. However, the barrier is that she doesn't have insurance.

## 2019-08-19 ENCOUNTER — Other Ambulatory Visit: Payer: Self-pay

## 2019-08-19 ENCOUNTER — Other Ambulatory Visit (HOSPITAL_COMMUNITY)
Admission: RE | Admit: 2019-08-19 | Discharge: 2019-08-19 | Disposition: A | Discharge status: Home / Self Care | Payer: Self-pay | Source: Ambulatory Visit | Attending: Physician Assistant | Admitting: Physician Assistant

## 2019-08-19 DIAGNOSIS — E785 Hyperlipidemia, unspecified: Secondary | ICD-10-CM | POA: Insufficient documentation

## 2019-08-19 DIAGNOSIS — I10 Essential (primary) hypertension: Secondary | ICD-10-CM | POA: Insufficient documentation

## 2019-08-19 LAB — LIPID PANEL
Cholesterol: 141 mg/dL (ref 0–200)
HDL: 30 mg/dL — ABNORMAL LOW (ref >40)
LDL Cholesterol: 74 mg/dL (ref 0–99)
Total CHOL/HDL Ratio: 4.7 RATIO
Triglycerides: 184 mg/dL — ABNORMAL HIGH (ref <150)
VLDL: 37 mg/dL (ref 0–40)

## 2019-08-19 LAB — COMPREHENSIVE METABOLIC PANEL
ALT: 20 U/L (ref 0–44)
AST: 16 U/L (ref 15–41)
Albumin: 3.7 g/dL (ref 3.5–5.0)
Alkaline Phosphatase: 64 U/L (ref 38–126)
Anion gap: 8 (ref 5–15)
BUN: 9 mg/dL (ref 6–20)
CO2: 25 mmol/L (ref 22–32)
Calcium: 9.1 mg/dL (ref 8.9–10.3)
Chloride: 104 mmol/L (ref 98–111)
Creatinine, Ser: 0.87 mg/dL (ref 0.44–1.00)
GFR calc Af Amer: >60 mL/min (ref >60)
GFR calc non Af Amer: >60 mL/min (ref >60)
Glucose, Bld: 111 mg/dL — ABNORMAL HIGH (ref 70–99)
Potassium: 3.7 mmol/L (ref 3.5–5.1)
Sodium: 137 mmol/L (ref 135–145)
Total Bilirubin: 0.5 mg/dL (ref 0.3–1.2)
Total Protein: 7.2 g/dL (ref 6.5–8.1)

## 2019-08-22 ENCOUNTER — Ambulatory Visit: Payer: Self-pay | Admitting: Physician Assistant

## 2019-08-22 ENCOUNTER — Encounter: Payer: Self-pay | Admitting: Physician Assistant

## 2019-08-22 VITALS — BP 130/80

## 2019-08-22 DIAGNOSIS — Z131 Encounter for screening for diabetes mellitus: Secondary | ICD-10-CM

## 2019-08-22 DIAGNOSIS — I10 Essential (primary) hypertension: Secondary | ICD-10-CM

## 2019-08-22 DIAGNOSIS — E785 Hyperlipidemia, unspecified: Secondary | ICD-10-CM

## 2019-08-22 DIAGNOSIS — Z79899 Other long term (current) drug therapy: Secondary | ICD-10-CM

## 2019-08-22 DIAGNOSIS — E669 Obesity, unspecified: Secondary | ICD-10-CM

## 2019-08-22 DIAGNOSIS — F39 Unspecified mood [affective] disorder: Secondary | ICD-10-CM

## 2019-08-22 DIAGNOSIS — R7309 Other abnormal glucose: Secondary | ICD-10-CM

## 2019-08-22 NOTE — Progress Notes (Signed)
BP 130/80    Subjective:    Patient ID: Rita Browning, female    DOB: [birth year], 39 y.o.   MRN: [id number]  HPI: Rita Browning is a 39 y.o. female presenting on 08/22/2019 for No chief complaint on file.   HPI   This is a telemedicine appointment through [employer] due to coronavirus pandemic.    I connected with  Rita Browning on 08/22/19 by a video enabled telemedicine application and verified that I am speaking with the correct person using two identifiers.   I discussed the limitations of evaluation and management by telemedicine. The patient expressed understanding and agreed to proceed.    Pt is a 75yoF with HTN, dyslipidemia and mental health issues.  She is seeing [facility] specialist regularly  She is still seeing the RD to get help with her weight.  She is still off smoking but does vape.    She says she is doing okay medically.       Relevant past medical, surgical, family and social history reviewed and updated as indicated. Interim medical history since our last visit reviewed. Allergies and medications reviewed and updated.   Current Outpatient Medications:  .  albuterol (VENTOLIN HFA) 108 (90 Base) MCG/ACT inhaler, Inhale 2 puffs into the lungs every 6 (six) hours as needed for wheezing or shortness of breath., Disp: 6.7 g, Rfl: 3 .  atorvastatin (LIPITOR) 10 MG tablet, Take 1 tablet (10 mg total) by mouth daily., Disp: 30 tablet, Rfl: 4 .  busPIRone (BUSPAR) 15 MG tablet, Take 15 mg by mouth 4 (four) times daily. , Disp: , Rfl:  .  clonazePAM (KLONOPIN) 1 MG tablet, Take 0.5 mg by mouth as needed for anxiety., Disp: , Rfl:  .  hydrochlorothiazide (MICROZIDE) 12.5 MG capsule, Take 1 capsule (12.5 mg total) by mouth daily., Disp: 30 capsule, Rfl: 4 .  hydrOXYzine (ATARAX/VISTARIL) 50 MG tablet, Take 50 mg by mouth at bedtime as needed. , Disp: , Rfl:  .  ibuprofen (ADVIL,MOTRIN) 200 MG tablet, Take 800 mg by mouth every 6 (six) hours as needed., Disp: , Rfl:  .  lithium 300 MG  tablet, Take 300 mg by mouth 4 (four) times daily., Disp: , Rfl:  .  metoprolol tartrate (LOPRESSOR) 50 MG tablet, Take 1 tablet (50 mg total) by mouth 2 (two) times daily., Disp: 60 tablet, Rfl: 1 .  Oxcarbazepine (TRILEPTAL) 300 MG tablet, Take 600 mg by mouth 3 (three) times daily. , Disp: , Rfl:  .  prazosin (MINIPRESS) 2 MG capsule, Take 1 capsule (2 mg total) by mouth at bedtime. For nightmares (Patient taking differently: Take 2 mg by mouth daily. For nightmares), Disp: 30 capsule, Rfl: 0 .  risperiDONE (RISPERDAL) 2 MG tablet, Take 2 mg by mouth at bedtime., Disp: , Rfl:     Review of Systems  Per HPI unless specifically indicated above     Objective:    BP 130/80   Wt Readings from Last 3 Encounters:  07/13/19 (!) 335 lb 9.6 oz (152.2 kg)  06/22/19 (!) 330 lb (149.7 kg)  05/23/19 (!) 338 lb (153.3 kg)      Physical Exam Constitutional:      General: She is not in acute distress.    Appearance: Normal appearance. She is obese. She is not ill-appearing.  HENT:     Head: Normocephalic and atraumatic.  Pulmonary:     Effort: Pulmonary effort is normal. No respiratory distress.  Neurological:     Mental Status: She  is alert and oriented to person, place, and time.  Psychiatric:        Attention and Perception: Attention normal.        Speech: Speech normal.        Behavior: Behavior normal. Behavior is cooperative.     Results for orders placed or performed during the hospital encounter of 08/19/19  Lipid panel  Result Value Ref Range   Cholesterol 141 0 - 200 mg/dL   Triglycerides 184 (H) <150 mg/dL   HDL 30 (L) >40 mg/dL   Total CHOL/HDL Ratio 4.7 RATIO   VLDL 37 0 - 40 mg/dL   LDL Cholesterol 74 0 - 99 mg/dL  Comprehensive metabolic panel  Result Value Ref Range   Sodium 137 135 - 145 mmol/L   Potassium 3.7 3.5 - 5.1 mmol/L   Chloride 104 98 - 111 mmol/L   CO2 25 22 - 32 mmol/L   Glucose, Bld 111 (H) 70 - 99 mg/dL   BUN 9 6 - 20 mg/dL   Creatinine, Ser  0.87 0.44 - 1.00 mg/dL   Calcium 9.1 8.9 - 10.3 mg/dL   Total Protein 7.2 6.5 - 8.1 g/dL   Albumin 3.7 3.5 - 5.0 g/dL   AST 16 15 - 41 U/L   ALT 20 0 - 44 U/L   Alkaline Phosphatase 64 38 - 126 U/L   Total Bilirubin 0.5 0.3 - 1.2 mg/dL   GFR calc non Af Amer >60 >60 mL/min   GFR calc Af Amer >60 >60 mL/min   Anion gap 8 5 - 15      Assessment & Plan:    Encounter Diagnoses  Name Primary?  . Essential hypertension Yes  . Hyperlipidemia, unspecified hyperlipidemia type   . Mood disorder ([place])   . Obesity, unspecified classification, unspecified obesity type, unspecified whether serious comorbidity present   . Polypharmacy   . Elevated glucose   . Screening for diabetes mellitus   . Lithium use        -Reviewed labs with pt -Will order lithium with next labs per pt request. -pt to continue with MH -no changes to medications today -pt to follow up 3 months.  She is to contact office sooner prn

## 2019-08-29 ENCOUNTER — Ambulatory Visit: Payer: Self-pay | Attending: Internal Medicine

## 2019-08-29 ENCOUNTER — Other Ambulatory Visit: Payer: Self-pay

## 2019-08-29 DIAGNOSIS — Z20822 Contact with and (suspected) exposure to covid-19: Secondary | ICD-10-CM | POA: Insufficient documentation

## 2019-08-30 LAB — SARS-COV-2, NAA 2 DAY TAT

## 2019-08-30 LAB — NOVEL CORONAVIRUS, NAA: SARS-CoV-2, NAA: NOT DETECTED

## 2019-09-29 ENCOUNTER — Other Ambulatory Visit: Payer: Self-pay | Admitting: Physician Assistant

## 2019-10-11 ENCOUNTER — Encounter: Payer: Self-pay | Admitting: Physician Assistant

## 2019-10-11 ENCOUNTER — Other Ambulatory Visit: Payer: Self-pay

## 2019-10-11 ENCOUNTER — Ambulatory Visit: Payer: Self-pay | Admitting: Physician Assistant

## 2019-10-11 VITALS — BP 100/82 | HR 85 | Temp 97.9°F | Wt 325.8 lb

## 2019-10-11 DIAGNOSIS — H6692 Otitis media, unspecified, left ear: Secondary | ICD-10-CM

## 2019-10-11 MED ORDER — AMOXICILLIN 500 MG PO CAPS
500.0000 mg | ORAL_CAPSULE | Freq: Three times a day (TID) | ORAL | 0 refills | Status: DC
Start: 2019-10-11 — End: 2019-11-21

## 2019-10-11 NOTE — Patient Instructions (Signed)

## 2019-10-11 NOTE — Progress Notes (Signed)
BP 100/82   Pulse 85   Temp 97.9 F (36.6 C)   Wt (!) 325 lb 12.8 oz (147.8 kg)   SpO2 97%   BMI 49.54 kg/m    Subjective:    Patient ID: Rita Browning, female    DOB: 1980-09-12, 39 y.o.   MRN: [id number]  HPI: Rita Browning is a 39 y.o. female presenting on 10/11/2019 for Ear Pain   HPI   Pt had a negative covid 19 screening questionnaire.  Pt c/o L Ear pain since yesterday and pain in jaw.  R ear feels good.  No cough or congestion.  Breathing is good.   She says she got covid vaccinations.   Relevant past medical, surgical, family and social history reviewed and updated as indicated. Interim medical history since our last visit reviewed. Allergies and medications reviewed and updated.   Current Outpatient Medications:  .  albuterol (VENTOLIN HFA) 108 (90 Base) MCG/ACT inhaler, Inhale 2 puffs into the lungs every 6 (six) hours as needed for wheezing or shortness of breath., Disp: 6.7 g, Rfl: 3 .  atorvastatin (LIPITOR) 10 MG tablet, Take 1 tablet (10 mg total) by mouth daily., Disp: 30 tablet, Rfl: 4 .  busPIRone (BUSPAR) 15 MG tablet, Take 15 mg by mouth 4 (four) times daily. , Disp: , Rfl:  .  clonazePAM (KLONOPIN) 1 MG tablet, Take 0.5 mg by mouth as needed for anxiety., Disp: , Rfl:  .  hydrochlorothiazide (MICROZIDE) 12.5 MG capsule, Take 1 capsule (12.5 mg total) by mouth daily., Disp: 30 capsule, Rfl: 4 .  hydrOXYzine (ATARAX/VISTARIL) 50 MG tablet, Take 50 mg by mouth at bedtime as needed. , Disp: , Rfl:  .  ibuprofen (ADVIL,MOTRIN) 200 MG tablet, Take 800 mg by mouth every 6 (six) hours as needed., Disp: , Rfl:  .  lithium 300 MG tablet, Take 300 mg by mouth 4 (four) times daily. 600mg  QAM, 600mg  in the evening, 300MG  at bedtime, Disp: , Rfl:  .  metoprolol tartrate (LOPRESSOR) 50 MG tablet, Take 1 tablet by mouth twice daily, Disp: 60 tablet, Rfl: 1 .  Oxcarbazepine (TRILEPTAL) 300 MG tablet, Take 600 mg by mouth 3 (three) times daily. , Disp: , Rfl:  .  prazosin  (MINIPRESS) 2 MG capsule, Take 1 capsule (2 mg total) by mouth at bedtime. For nightmares (Patient taking differently: Take 2 mg by mouth daily. For nightmares), Disp: 30 capsule, Rfl: 0 .  risperiDONE (RISPERDAL) 2 MG tablet, Take 2 mg by mouth at bedtime., Disp: , Rfl:      Review of Systems  Per HPI unless specifically indicated above     Objective:    BP 100/82   Pulse 85   Temp 97.9 F (36.6 C)   Wt (!) 325 lb 12.8 oz (147.8 kg)   SpO2 97%   BMI 49.54 kg/m   Wt Readings from Last 3 Encounters:  10/11/19 (!) 325 lb 12.8 oz (147.8 kg)  07/13/19 (!) 335 lb 9.6 oz (152.2 kg)  06/22/19 (!) 330 lb (149.7 kg)    Physical Exam Constitutional:      General: She is not in acute distress.    Appearance: She is not toxic-appearing.  HENT:     Head: Normocephalic and atraumatic.     Right Ear: Tympanic membrane, ear canal and external ear normal.     Left Ear: Tympanic membrane is injected.     Nose: No congestion.  Pulmonary:     Effort: Pulmonary effort is normal.  No respiratory distress.  Skin:    General: Skin is warm and dry.  Neurological:     Mental Status: She is alert and oriented to person, place, and time.  Psychiatric:        Mood and Affect: Mood normal.        Behavior: Behavior normal.          Assessment & Plan:   Encounter Diagnosis  Name Primary?  . Left otitis media, unspecified otitis media type Yes      -rx amoxil -Follow up July as scheduled.  Contact office sooner if worsens or persists

## 2019-10-18 ENCOUNTER — Ambulatory Visit: Payer: Self-pay | Admitting: Nutrition

## 2019-11-10 ENCOUNTER — Encounter: Payer: Self-pay | Attending: Physician Assistant | Admitting: Nutrition

## 2019-11-10 ENCOUNTER — Encounter: Payer: Self-pay | Admitting: Nutrition

## 2019-11-10 ENCOUNTER — Other Ambulatory Visit: Payer: Self-pay

## 2019-11-10 VITALS — Ht 68.0 in | Wt 333.0 lb

## 2019-11-10 DIAGNOSIS — Z6841 Body Mass Index (BMI) 40.0 and over, adult: Secondary | ICD-10-CM | POA: Insufficient documentation

## 2019-11-10 DIAGNOSIS — I10 Essential (primary) hypertension: Secondary | ICD-10-CM | POA: Insufficient documentation

## 2019-11-10 DIAGNOSIS — E782 Mixed hyperlipidemia: Secondary | ICD-10-CM | POA: Insufficient documentation

## 2019-11-10 DIAGNOSIS — R739 Hyperglycemia, unspecified: Secondary | ICD-10-CM | POA: Insufficient documentation

## 2019-11-10 NOTE — Progress Notes (Signed)
Medical Nutrition Therapy:  Appt start time: 2637 end time:  1115   Assessment:  Primary concerns today: Obesity, HTN, Hyperlipidemia and impaired fasting glucose.   "I don't feel like I'm making progress. I'm trying." Has been having to care for my mom and getting her to eat healthier too."  Lost 2 lbs. Has made some changes but not consistent enough for significant weight loss. Is eating 3 meals a day now. Cutting down on soda and working on drinking cirkus water. Motivated to start walking some each now for needed weight loss.  CMP Latest Ref Rng & Units 08/19/2019 05/20/2019 01/24/2019  Glucose 70 - 99 mg/dL 111(H) 106(H) 114(H)  BUN 6 - 20 mg/dL 9 9 8   Creatinine 0.44 - 1.00 mg/dL 0.87 0.80 0.89  Sodium 135 - 145 mmol/L 137 138 140  Potassium 3.5 - 5.1 mmol/L 3.7 3.9 4.0  Chloride 98 - 111 mmol/L 104 104 105  CO2 22 - 32 mmol/L 25 23 26   Calcium 8.9 - 10.3 mg/dL 9.1 9.1 9.0  Total Protein 6.5 - 8.1 g/dL 7.2 7.4 7.2  Total Bilirubin 0.3 - 1.2 mg/dL 0.5 0.6 0.6  Alkaline Phos 38 - 126 U/L 64 56 61  AST 15 - 41 U/L 16 20 19   ALT 0 - 44 U/L 20 22 21    Wt Readings from Last 3 Encounters:  11/10/19 (!) 333 lb (151 kg)  10/11/19 (!) 325 lb 12.8 oz (147.8 kg)  07/13/19 (!) 335 lb 9.6 oz (152.2 kg)   Ht Readings from Last 3 Encounters:  11/10/19 5\' 8"  (1.727 m)  07/13/19 5\' 8"  (1.727 m)  05/18/19 5\' 8"  (1.727 m)   Body mass index is 50.63 kg/m. @BMIFA @ Facility age limit for growth percentiles is 20 years. Facility age limit for growth percentiles is 20 years. Lipid Panel     Component Value Date/Time   CHOL 141 08/19/2019 0847   TRIG 184 (H) 08/19/2019 0847   HDL 30 (L) 08/19/2019 0847   CHOLHDL 4.7 08/19/2019 0847   VLDL 37 08/19/2019 0847   LDLCALC 74 08/19/2019 0847   Preferred Learning Style:   No preference indicated   Learning Readiness:   Ready  Change in progress   MEDICATIONS:   DIETARY INTAKE:  Eating more foods at home. Cooking healthier foods.  Cutting out fast food and processed foods more as time allows..   Usual physical activity:Will walk some during day  Estimated energy needs: 1200 calories 130 g carbohydrates 90 g protein 60 g fat  Progress Towards Goal(s):  In progress.   Nutritional Diagnosis:  NB-1.1 Food and nutrition-related knowledge deficit As related to Obesity, HTN and Hyperlipidemia.  As evidenced by BMI 50, TCHOL> 200 and LDL > 100.Marland Kitchen    Intervention:  Nutrition and PRE- Diabetes education provided on My Plate, CHO counting, meal planning, portion sizes, timing of meals, avoiding snacks between meals  taking medications as prescribed, benefits of exercising 60 minutes per day and prevention of DM. Weight loss tips,     Goal   Continue to increase water and cut out sodas. Continue to make better food choices. Increase fresh fruits and vegetables. Lose 1-2 lbs per week. Walk 30 minutes a day.   Teaching Method Utilized:  Visual Auditory Hands on  Handouts given during visit include:  The Plate Method   Meal PLan Card    Barriers to learning/adherence to lifestyle change:   Demonstrated degree of understanding via:  Teach Back   Monitoring/Evaluation:  Dietary  intake, exercise,  and body weight in 2 month(s). She would benefit from Ozmepic or victoza for weight loss. However, the barrier is that she doesn't have insurance.

## 2019-11-10 NOTE — Patient Instructions (Addendum)
Goals  Continue to increase water and cut out sodas. Continue to make better food choices. Increase fresh fruits and vegetables. Lose 1-2 lbs per week. Walk 30 minutes a day.

## 2019-11-21 ENCOUNTER — Ambulatory Visit: Payer: Self-pay | Admitting: Physician Assistant

## 2019-11-21 ENCOUNTER — Encounter: Payer: Self-pay | Admitting: Physician Assistant

## 2019-11-21 ENCOUNTER — Other Ambulatory Visit: Payer: Self-pay

## 2019-11-21 VITALS — BP 112/70 | HR 72 | Temp 97.7°F | Ht 68.0 in | Wt 329.0 lb

## 2019-11-21 DIAGNOSIS — S39012A Strain of muscle, fascia and tendon of lower back, initial encounter: Secondary | ICD-10-CM

## 2019-11-21 DIAGNOSIS — F39 Unspecified mood [affective] disorder: Secondary | ICD-10-CM

## 2019-11-21 DIAGNOSIS — I10 Essential (primary) hypertension: Secondary | ICD-10-CM

## 2019-11-21 DIAGNOSIS — Z79899 Other long term (current) drug therapy: Secondary | ICD-10-CM

## 2019-11-21 DIAGNOSIS — E785 Hyperlipidemia, unspecified: Secondary | ICD-10-CM

## 2019-11-21 MED ORDER — CYCLOBENZAPRINE HCL 10 MG PO TABS
10.0000 mg | ORAL_TABLET | Freq: Three times a day (TID) | ORAL | 0 refills | Status: DC | PRN
Start: 2019-11-21 — End: 2020-11-08

## 2019-11-21 MED ORDER — DICLOFENAC SODIUM 75 MG PO TBEC: 75.0000 mg | DELAYED_RELEASE_TABLET | Freq: Two times a day (BID) | ORAL | 0 refills | Status: AC

## 2019-11-21 NOTE — Progress Notes (Signed)
BP 112/70   Pulse 72   Temp 97.7 F (36.5 C)   Ht 5\' 8"  (1.727 m)   Wt (!) 329 lb (149.2 kg)   SpO2 97%   BMI 50.02 kg/m    Subjective:    Patient ID: Rita Browning, female    DOB: May 31, 1980, 39 y.o.   MRN: [id number]  HPI: Rita Browning is a 39 y.o. female presenting on 11/21/2019 for No chief complaint on file.   HPI   Pt had a negative covid 19 screening questionnaire.    Pt is 39yo with HTN, dyslipidemia and MH issues.    She Uses her inhaler about 1/week.  She quit smoking January 2020.    She is still [facility].  Pt didn't get labs drawn- she forgot  Pt got covid vaccination, both doses- she didn't bring her card (for 2nd dose info)  She has some Labs for [employer] [facility] [employer] on her phone.  It shows   A1C 5.0  She says her Back is bothering her for about 4 days- she thinks she strained it.  IBU not helping.    No numbness or weakness of the lower extremities.  She is seeing dietician.         Relevant past medical, surgical, family and social history reviewed and updated as indicated. Interim medical history since our last visit reviewed. Allergies and medications reviewed and updated.    Current Outpatient Medications:  .  albuterol (VENTOLIN HFA) 108 (90 Base) MCG/ACT inhaler, Inhale 2 puffs into the lungs every 6 (six) hours as needed for wheezing or shortness of breath., Disp: 6.7 g, Rfl: 3 .  atorvastatin (LIPITOR) 10 MG tablet, Take 1 tablet (10 mg total) by mouth daily., Disp: 30 tablet, Rfl: 4 .  busPIRone (BUSPAR) 15 MG tablet, Take 15 mg by mouth 4 (four) times daily. , Disp: , Rfl:  .  clonazePAM (KLONOPIN) 1 MG tablet, Take 0.5 mg by mouth as needed for anxiety., Disp: , Rfl:  .  hydrochlorothiazide (MICROZIDE) 12.5 MG capsule, Take 1 capsule (12.5 mg total) by mouth daily., Disp: 30 capsule, Rfl: 4 .  hydrOXYzine (ATARAX/VISTARIL) 50 MG tablet, Take 50 mg by mouth at bedtime as needed. , Disp: , Rfl:  .  ibuprofen (ADVIL,MOTRIN) 200 MG tablet, Take  800 mg by mouth every 6 (six) hours as needed., Disp: , Rfl:  .  lithium 300 MG tablet, Take 300 mg by mouth 4 (four) times daily. 600mg  QAM, 600mg  in the evening, 300MG  at bedtime, Disp: , Rfl:  .  metoprolol tartrate (LOPRESSOR) 50 MG tablet, Take 1 tablet by mouth twice daily, Disp: 60 tablet, Rfl: 1 .  Oxcarbazepine (TRILEPTAL) 300 MG tablet, Take 600 mg by mouth 3 (three) times daily. , Disp: , Rfl:  .  prazosin (MINIPRESS) 2 MG capsule, Take 1 capsule (2 mg total) by mouth at bedtime. For nightmares (Patient taking differently: Take 2 mg by mouth daily. For nightmares), Disp: 30 capsule, Rfl: 0 .  risperiDONE (RISPERDAL) 2 MG tablet, Take 2 mg by mouth at bedtime., Disp: , Rfl:     Review of Systems  Per HPI unless specifically indicated above     Objective:    BP 112/70   Pulse 72   Temp 97.7 F (36.5 C)   Ht 5\' 8"  (1.727 m)   Wt (!) 329 lb (149.2 kg)   SpO2 97%   BMI 50.02 kg/m   Wt Readings from Last 3 Encounters:  11/21/19 Marland Kitchen)  329 lb (149.2 kg)  11/10/19 (!) 333 lb (151 kg)  10/11/19 (!) 325 lb 12.8 oz (147.8 kg)    Physical Exam Vitals reviewed.  Constitutional:      General: She is not in acute distress.    Appearance: She is well-developed. She is obese.  HENT:     Head: Normocephalic and atraumatic.  Cardiovascular:     Rate and Rhythm: Normal rate and regular rhythm.  Pulmonary:     Effort: Pulmonary effort is normal.     Breath sounds: Normal breath sounds.  Abdominal:     General: Bowel sounds are normal.     Palpations: Abdomen is soft. There is no mass.     Tenderness: There is no abdominal tenderness.  Musculoskeletal:     Cervical back: Neck supple.     Lumbar back: Tenderness present. No bony tenderness. Normal range of motion. Negative right straight leg raise test and negative left straight leg raise test.       Back:     Right lower leg: No edema.     Left lower leg: No edema.  Lymphadenopathy:     Cervical: No cervical adenopathy.    Skin:    General: Skin is warm and dry.  Neurological:     Mental Status: She is alert and oriented to person, place, and time.  Psychiatric:        Behavior: Behavior normal.           Assessment & Plan:   Encounter Diagnoses  Name Primary?  . Essential hypertension Yes  . Hyperlipidemia, unspecified hyperlipidemia type   . Mood disorder ([place])   . Polypharmacy   . Morbid obesity ([place])   . Lithium use   . Back strain, initial encounter       -pt counseled to Get labs.  She will be called results -rx flexeril and diclofenac (nsaid per pt request).  Pt counseled to use ice/heat -pap due April 2022 -pt to continue current medications -pt to continue with MH -encouraged pt to continue with weight loss efforts -pt to follow up in 3 months.  She is to contact office sooner prn

## 2019-11-21 NOTE — Patient Instructions (Addendum)
Back Exercises The following exercises strengthen the muscles that help to support the trunk and back. They also help to keep the lower back flexible. Doing these exercises can help to prevent back pain or lessen existing pain.  If you have back pain or discomfort, try doing these exercises 2-3 times each day or as told by your health care provider.  As your pain improves, do them once each day, but increase the number of times that you repeat the steps for each exercise (do more repetitions).  To prevent the recurrence of back pain, continue to do these exercises once each day or as told by your health care provider. Do exercises exactly as told by your health care provider and adjust them as directed. It is normal to feel mild stretching, pulling, tightness, or discomfort as you do these exercises, but you should stop right away if you feel sudden pain or your pain gets worse. Exercises Single knee to chest Repeat these steps 3-5 times for each leg: 1. Lie on your back on a firm bed or the floor with your legs extended. 2. Bring one knee to your chest. Your other leg should stay extended and in contact with the floor. 3. Hold your knee in place by grabbing your knee or thigh with both hands and hold. 4. Pull on your knee until you feel a gentle stretch in your lower back or buttocks. 5. Hold the stretch for 10-30 seconds. 6. Slowly release and straighten your leg. Pelvic tilt Repeat these steps 5-10 times: 1. Lie on your back on a firm bed or the floor with your legs extended. 2. Bend your knees so they are pointing toward the ceiling and your feet are flat on the floor. 3. Tighten your lower abdominal muscles to press your lower back against the floor. This motion will tilt your pelvis so your tailbone points up toward the ceiling instead of pointing to your feet or the floor. 4. With gentle tension and even breathing, hold this position for 5-10 seconds. Cat-cow Repeat these steps until  your lower back becomes more flexible: 1. Get into a hands-and-knees position on a firm surface. Keep your hands under your shoulders, and keep your knees under your hips. You may place padding under your knees for comfort. 2. Let your head hang down toward your chest. Contract your abdominal muscles and point your tailbone toward the floor so your lower back becomes rounded like the back of a cat. 3. Hold this position for 5 seconds. 4. Slowly lift your head, let your abdominal muscles relax and point your tailbone up toward the ceiling so your back forms a sagging arch like the back of a cow. 5. Hold this position for 5 seconds.  Press-ups Repeat these steps 5-10 times: 1. Lie on your abdomen (face-down) on the floor. 2. Place your palms near your head, about shoulder-width apart. 3. Keeping your back as relaxed as possible and keeping your hips on the floor, slowly straighten your arms to raise the top half of your body and lift your shoulders. Do not use your back muscles to raise your upper torso. You may adjust the placement of your hands to make yourself more comfortable. 4. Hold this position for 5 seconds while you keep your back relaxed. 5. Slowly return to lying flat on the floor.  Bridges Repeat these steps 10 times: 1. Lie on your back on a firm surface. 2. Bend your knees so they are pointing toward the ceiling and   your feet are flat on the floor. Your arms should be flat at your sides, next to your body. 3. Tighten your buttocks muscles and lift your buttocks off the floor until your waist is at almost the same height as your knees. You should feel the muscles working in your buttocks and the back of your thighs. If you do not feel these muscles, slide your feet 1-2 inches farther away from your buttocks. 4. Hold this position for 3-5 seconds. 5. Slowly lower your hips to the starting position, and allow your buttocks muscles to relax completely. If this exercise is too easy, try  doing it with your arms crossed over your chest. Abdominal crunches Repeat these steps 5-10 times: 1. Lie on your back on a firm bed or the floor with your legs extended. 2. Bend your knees so they are pointing toward the ceiling and your feet are flat on the floor. 3. Cross your arms over your chest. 4. Tip your chin slightly toward your chest without bending your neck. 5. Tighten your abdominal muscles and slowly raise your trunk (torso) high enough to lift your shoulder blades a tiny bit off the floor. Avoid raising your torso higher than that because it can put too much stress on your low back and does not help to strengthen your abdominal muscles. 6. Slowly return to your starting position. Back lifts Repeat these steps 5-10 times: 1. Lie on your abdomen (face-down) with your arms at your sides, and rest your forehead on the floor. 2. Tighten the muscles in your legs and your buttocks. 3. Slowly lift your chest off the floor while you keep your hips pressed to the floor. Keep the back of your head in line with the curve in your back. Your eyes should be looking at the floor. 4. Hold this position for 3-5 seconds. 5. Slowly return to your starting position. Contact a health care provider if:  Your back pain or discomfort gets much worse when you do an exercise.  Your worsening back pain or discomfort does not lessen within 2 hours after you exercise. If you have any of these problems, stop doing these exercises right away. Do not do them again unless your health care provider says that you can. Get help right away if:  You develop sudden, severe back pain. If this happens, stop doing the exercises right away. Do not do them again unless your health care provider says that you can. This information is not intended to replace advice given to you by your health care provider. Make sure you discuss any questions you have with your health care provider. Document Revised: 08/19/2018 Document  Reviewed: 01/14/2018 Elsevier Patient Education  [place].   -------------------------------------------------------     Lithium Toxicity Lithium toxicity, which is also called lithium poisoning, is the condition of having too much lithium in the blood. Lithium is a medicine that is used to treat bipolar disorder. Lithium toxicity can be life-threatening. What are the causes? This condition is caused by:  Ingesting too much lithium, causing levels of lithium to rise in your body.  Taking lithium on a regular basis and: ? Having a condition that raises lithium levels in the body. ? Taking another medicine that raises lithium levels in the body.  Taking excess amounts of lithium in trying to take one's life (suicide attempt).  A decrease in kidney (renal) function because of dehydration, or as a side effect of another medicine. What increases the risk? This condition  is more likely to develop in people:  Who are young or old.  Who have kidney disease.  Who have heart disease.  Who have dehydration that is caused by sweating or diarrhea.  Who have low sodium levels in the body. Certain medicines can increase the risk for this condition. They include:  Water pills (diuretics).  Certain medicines for high blood pressure.  Nonsteroidal anti-inflammatory drugs (NSAIDs). What are the signs or symptoms? Symptoms of mild to moderate lithium toxicity are:  Nausea and vomiting.  Diarrhea.  Drowsiness.  Thirst.  Muscle weakness.  Slurred speech.  Lack of coordination.  Frequent urination.  Being restless (agitation). The symptoms of moderate to severe lithium toxicity are:  Blurred vision.  Giddiness.  Ringing in the ears.  Severe muscle spasms.  Seizures.  Abnormal heart rhythm.  Loss of consciousness or coma. How is this diagnosed? This condition may be diagnosed based on:  Your signs and symptoms.  Your use of lithium. You will be  asked about how much lithium you take, how long you have been taking it, and when you took your last dose.  Blood tests. These are done to check the level of lithium in your blood. How is this treated? Treatment for this condition depends on how severe it is. It often involves special monitoring and hospitalization.  For mild or moderate toxicity, your dosage of lithium may be reduced or stopped.  For severe toxicity, lithium may be removed from your body. This is done in a hospital emergency department. It may involve: ? Gastric lavage. A tube is placed through your nose or mouth into your stomach. The tube is used to remove lithium that has not yet been digested. It may also be used to put medicines directly into your stomach to help stop lithium from being absorbed. ? Medicines that increase removal of lithium by your kidneys. ? Use of an artificial kidney to clean your blood (dialysis). This is usually done only in the most severe cases. Follow these instructions at home:      Take over-the-counter and prescription medicines only as told by your health care provider.  Drink enough fluid to keep your urine pale yellow.  If your toxicity was a result of an intentional overdose, work with a Musician (psychiatrist or counselor). How is this prevented? Lithium toxicity is preventable. To keep it from recurring:  Take medicines only as told by your health care provider.  Drink enough fluid to keep your urine pale yellow.  Talk with your health care provider before starting a low-salt (low-sodium) diet.  Have your blood lithium levels checked regularly.  Watch for signs and symptoms of lithium toxicity. If you have signs or symptoms, get treatment early. This can help keep severe symptoms from developing.  Always ask about the risk of interactions when starting a new medicine. Contact a health care provider if:  You have signs or symptoms of mild or moderate  lithium toxicity after you receive treatment, even if your blood lithium level is normal. Get help right away if:  Your symptoms get worse.  You have signs or symptoms of severe lithium toxicity after you receive treatment. Summary  Lithium toxicity, which is also called lithium poisoning, is the condition of having too much lithium in your blood.  This either results from taking too much lithium or from changes that cause the medicine to be eliminated from your body more slowly.  Treatment depends on the severity of the condition  but often involves special monitoring and hospitalization. This information is not intended to replace advice given to you by your health care provider. Make sure you discuss any questions you have with your health care provider. Document Revised: 08/06/2018 Document Reviewed: 04/24/2017 Elsevier Patient Education  [place].

## 2019-12-13 ENCOUNTER — Ambulatory Visit: Payer: Self-pay | Admitting: Nutrition

## 2019-12-22 ENCOUNTER — Other Ambulatory Visit: Payer: Self-pay

## 2019-12-22 ENCOUNTER — Ambulatory Visit (HOSPITAL_COMMUNITY)
Admission: RE | Admit: 2019-12-22 | Discharge: 2019-12-22 | Disposition: A | Discharge status: Home / Self Care | Payer: Self-pay | Source: Ambulatory Visit | Attending: Physician Assistant | Admitting: Physician Assistant

## 2019-12-22 ENCOUNTER — Encounter: Payer: Self-pay | Admitting: Physician Assistant

## 2019-12-22 ENCOUNTER — Ambulatory Visit: Payer: Self-pay | Admitting: Physician Assistant

## 2019-12-22 VITALS — BP 116/78 | HR 85 | Temp 98.1°F | Ht 68.0 in | Wt 330.0 lb

## 2019-12-22 DIAGNOSIS — M79672 Pain in left foot: Secondary | ICD-10-CM | POA: Insufficient documentation

## 2019-12-22 DIAGNOSIS — Z23 Encounter for immunization: Secondary | ICD-10-CM

## 2019-12-22 NOTE — Patient Instructions (Signed)

## 2019-12-22 NOTE — Progress Notes (Signed)
BP 116/78   Pulse 85   Temp 98.1 F (36.7 C)   Ht 5\' 8"  (1.727 m)   SpO2 97%   BMI 50.02 kg/m    Subjective:    Patient ID: Rita Browning, female    DOB: [birth year], 39 y.o.   MRN: [id number]  HPI: Rita Browning is a 39 y.o. female presenting on 12/22/2019 for Foot Pain (outer anterior aspect of L foot. pt states glass bottle fell on top of foot about 2-3 weeks ago. pt reports pain has gotten worse pt states painful to walk, stand and wear shoes.)   HPI    Pt had a negative covid 19 screening questionnaire.   Chief Complaint  Patient presents with  . Foot Pain    outer anterior aspect of L foot. pt states glass bottle fell on top of foot about 2-3 weeks ago. pt reports pain has gotten worse pt states painful to walk, stand and wear shoes.      Relevant past medical, surgical, family and social history reviewed and updated as indicated. Interim medical history since our last visit reviewed. Allergies and medications reviewed and updated.   Current Outpatient Medications:  .  albuterol (VENTOLIN HFA) 108 (90 Base) MCG/ACT inhaler, Inhale 2 puffs into the lungs every 6 (six) hours as needed for wheezing or shortness of breath., Disp: 6.7 g, Rfl: 3 .  atorvastatin (LIPITOR) 10 MG tablet, Take 1 tablet (10 mg total) by mouth daily., Disp: 30 tablet, Rfl: 4 .  busPIRone (BUSPAR) 15 MG tablet, Take 15 mg by mouth 4 (four) times daily. , Disp: , Rfl:  .  clonazePAM (KLONOPIN) 1 MG tablet, Take 0.5 mg by mouth as needed for anxiety., Disp: , Rfl:  .  cyclobenzaprine (FLEXERIL) 10 MG tablet, Take 1 tablet (10 mg total) by mouth 3 (three) times daily as needed for muscle spasms., Disp: 30 tablet, Rfl: 0 .  diclofenac (VOLTAREN) 75 MG EC tablet, Take 1 tablet (75 mg total) by mouth 2 (two) times daily., Disp: 60 tablet, Rfl: 0 .  hydrochlorothiazide (MICROZIDE) 12.5 MG capsule, Take 1 capsule (12.5 mg total) by mouth daily., Disp: 30 capsule, Rfl: 4 .  hydrOXYzine (ATARAX/VISTARIL) 50 MG  tablet, Take 50 mg by mouth at bedtime as needed. , Disp: , Rfl:  .  ibuprofen (ADVIL,MOTRIN) 200 MG tablet, Take 800 mg by mouth every 6 (six) hours as needed., Disp: , Rfl:  .  lithium 300 MG tablet, Take 300 mg by mouth 4 (four) times daily. 300mg  QAM, 300mg  in the evening starting 08/30 then 300MG  at bedtime, Disp: , Rfl:  .  metoprolol tartrate (LOPRESSOR) 50 MG tablet, Take 1 tablet by mouth twice daily, Disp: 60 tablet, Rfl: 1 .  Oxcarbazepine (TRILEPTAL) 300 MG tablet, Take 600 mg by mouth 3 (three) times daily. , Disp: , Rfl:  .  prazosin (MINIPRESS) 2 MG capsule, Take 1 capsule (2 mg total) by mouth at bedtime. For nightmares (Patient taking differently: Take 2 mg by mouth daily. For nightmares), Disp: 30 capsule, Rfl: 0 .  risperiDONE (RISPERDAL) 2 MG tablet, Take 2 mg by mouth at bedtime., Disp: , Rfl:     Review of Systems  Per HPI unless specifically indicated above     Objective:    BP 116/78   Pulse 85   Temp 98.1 F (36.7 C)   Ht 5\' 8"  (1.727 m)   SpO2 97%   BMI 50.02 kg/m   Wt Readings from Last 3 Encounters:  11/21/19 (!) 329 lb (149.2 kg)  11/10/19 (!) 333 lb (151 kg)  10/11/19 (!) 325 lb 12.8 oz (147.8 kg)    Physical Exam Constitutional:      General: She is not in acute distress.    Appearance: She is obese. She is not ill-appearing.  HENT:     Head: Normocephalic and atraumatic.  Pulmonary:     Effort: Pulmonary effort is normal. No respiratory distress.  Musculoskeletal:     Left foot: Normal range of motion and normal capillary refill. Tenderness present. No swelling, deformity or laceration. Normal pulse.     Comments: Tenderness over proximal 5th MT area with some mild bruising.   Neurological:     Mental Status: She is alert and oriented to person, place, and time.  Psychiatric:        Attention and Perception: Attention normal.        Speech: Speech normal.        Behavior: Behavior normal. Behavior is cooperative.             Assessment & Plan:   Encounter Diagnoses  Name Primary?  . Left foot pain Yes  . Need for Tdap vaccination       -Ice elevate IBU -Xray -Cast shoe or supportive shoe like lace-up hard soled shoe, sneakers if that is all she has -[employer] financial assistance application is given to the pt -routine update of Tdap while in the office today

## 2020-01-09 ENCOUNTER — Encounter: Payer: Self-pay | Admitting: Dietician

## 2020-01-09 ENCOUNTER — Encounter: Payer: Self-pay | Attending: Physician Assistant | Admitting: Dietician

## 2020-01-09 ENCOUNTER — Other Ambulatory Visit: Payer: Self-pay

## 2020-01-09 DIAGNOSIS — E782 Mixed hyperlipidemia: Secondary | ICD-10-CM | POA: Insufficient documentation

## 2020-01-09 DIAGNOSIS — Z6841 Body Mass Index (BMI) 40.0 and over, adult: Secondary | ICD-10-CM | POA: Insufficient documentation

## 2020-01-09 NOTE — Progress Notes (Signed)
Medical Nutrition Therapy:  Appt start time: 1000 end time:  1030   Assessment:  Primary concerns today: Obesity, HTN, Hyperlipidemia and impaired fasting glucose.    Pt reports discontinuing lithium due to not feeling like it is doing it's job. Switched to Effexor and feels much better. Pt finds it difficult to follow the plate method. Pt has been making some cutbacks when eating out. Pt reports boredom eating, but less than before. Pt is also making better choices when snacking. Pt has cut down on [facility] from 4 cans a day to 1-2. Pt is making better food choices. Buying more fresh fruit.  Pt has a foot and back injury that is going to keep her from walking a lot. Pt is taking care of her mother at home, states that it is stressful and demanding.    CMP Latest Ref Rng & Units 08/19/2019 05/20/2019 01/24/2019  Glucose 70 - 99 mg/dL 111(H) 106(H) 114(H)  BUN 6 - 20 mg/dL 9 9 8   Creatinine 0.44 - 1.00 mg/dL 0.87 0.80 0.89  Sodium 135 - 145 mmol/L 137 138 140  Potassium 3.5 - 5.1 mmol/L 3.7 3.9 4.0  Chloride 98 - 111 mmol/L 104 104 105  CO2 22 - 32 mmol/L 25 23 26   Calcium 8.9 - 10.3 mg/dL 9.1 9.1 9.0  Total Protein 6.5 - 8.1 g/dL 7.2 7.4 7.2  Total Bilirubin 0.3 - 1.2 mg/dL 0.5 0.6 0.6  Alkaline Phos 38 - 126 U/L 64 56 61  AST 15 - 41 U/L 16 20 19   ALT 0 - 44 U/L 20 22 21    Wt Readings from Last 3 Encounters:  12/22/19 (!) 330 lb (149.7 kg)  11/21/19 (!) 329 lb (149.2 kg)  11/10/19 (!) 333 lb (151 kg)   Ht Readings from Last 3 Encounters:  12/22/19 5\' 8"  (1.727 m)  11/21/19 5\' 8"  (1.727 m)  11/10/19 5\' 8"  (1.727 m)   There is no height or weight on file to calculate BMI. @BMIFA @ Facility age limit for growth percentiles is 20 years. Facility age limit for growth percentiles is 20 years. Lipid Panel     Component Value Date/Time   CHOL 141 08/19/2019 0847   TRIG 184 (H) 08/19/2019 0847   HDL 30 (L) 08/19/2019 0847   CHOLHDL 4.7 08/19/2019 0847   VLDL 37 08/19/2019  0847   LDLCALC 74 08/19/2019 0847   Preferred Learning Style:   No preference indicated   Learning Readiness:   Ready  Change in progress   MEDICATIONS: Effexor (NEW)   DIETARY INTAKE:  Eating more foods at home. Cooking healthier foods. Cutting out fast food and processed foods more as time allows.   Usual physical activity:Will walk some during day  Estimated energy needs: 1200 calories 130 g carbohydrates 90 g protein 60 g fat  Progress Towards Goal(s):  In progress.   Nutritional Diagnosis:  NB-1.1 Food and nutrition-related knowledge deficit As related to Obesity, HTN and Hyperlipidemia.  As evidenced by BMI 50, TCHOL> 200 and LDL > 100.Marland Kitchen    Intervention:  Nutrition and PRE- Diabetes education provided on My Plate, CHO counting, meal planning, portion sizes, timing of meals, avoiding snacks between meals  taking medications as prescribed, benefits of exercising 60 minutes per day and prevention of DM. Weight loss tips,     Goal   Pace yourself walking to see how your body does with it.  Work up to 30 minutes every other day.  Continue to increase water and cut  out sodas.  1 soda a day  Continue to make better food choices.  Eat frozen vegetables 4 days a week  Lose 1-2 lbs per week.   Teaching Method Utilized:  Visual Auditory Hands on  Handouts given during visit include:  The Plate Method   Meal Plan Card  Barriers to learning/adherence to lifestyle change:   Demonstrated degree of understanding via:  Teach Back   Monitoring/Evaluation:  Dietary intake, exercise,  and body weight in 2 month(s). She would benefit from Ozmepic or victoza for weight loss. However, the barrier is that she doesn't have insurance.

## 2020-01-09 NOTE — Patient Instructions (Addendum)
   Pace yourself walking to see how your body does with it.  Work up to 30 minutes every other day.  Continue to increase water and cut out sodas.  1 soda a day  Continue to make better food choices.  Eat frozen vegetables 4 days a week  Lose 1-2 lbs per week.

## 2020-01-26 ENCOUNTER — Other Ambulatory Visit: Payer: Self-pay | Admitting: Physician Assistant

## 2020-01-26 MED ORDER — HYDROCHLOROTHIAZIDE 12.5 MG PO CAPS
12.5000 mg | ORAL_CAPSULE | Freq: Every day | ORAL | 4 refills | Status: DC
Start: 2020-01-26 — End: 2020-03-13

## 2020-02-16 ENCOUNTER — Other Ambulatory Visit: Payer: Self-pay

## 2020-02-16 ENCOUNTER — Other Ambulatory Visit (HOSPITAL_COMMUNITY)
Admission: RE | Admit: 2020-02-16 | Discharge: 2020-02-16 | Disposition: A | Discharge status: Home / Self Care | Payer: Self-pay | Source: Ambulatory Visit | Attending: Physician Assistant | Admitting: Physician Assistant

## 2020-02-16 DIAGNOSIS — E785 Hyperlipidemia, unspecified: Secondary | ICD-10-CM | POA: Insufficient documentation

## 2020-02-16 DIAGNOSIS — I10 Essential (primary) hypertension: Secondary | ICD-10-CM | POA: Insufficient documentation

## 2020-02-16 LAB — COMPREHENSIVE METABOLIC PANEL
ALT: 26 U/L (ref 0–44)
AST: 21 U/L (ref 15–41)
Albumin: 3.8 g/dL (ref 3.5–5.0)
Alkaline Phosphatase: 57 U/L (ref 38–126)
Anion gap: 9 (ref 5–15)
BUN: 11 mg/dL (ref 6–20)
CO2: 27 mmol/L (ref 22–32)
Calcium: 9.2 mg/dL (ref 8.9–10.3)
Chloride: 103 mmol/L (ref 98–111)
Creatinine, Ser: 0.77 mg/dL (ref 0.44–1.00)
GFR, Estimated: >60 mL/min (ref >60)
Glucose, Bld: 107 mg/dL — ABNORMAL HIGH (ref 70–99)
Potassium: 4.2 mmol/L (ref 3.5–5.1)
Sodium: 139 mmol/L (ref 135–145)
Total Bilirubin: 0.6 mg/dL (ref 0.3–1.2)
Total Protein: 7.2 g/dL (ref 6.5–8.1)

## 2020-02-16 LAB — LIPID PANEL
Cholesterol: 199 mg/dL (ref 0–200)
HDL: 32 mg/dL — ABNORMAL LOW (ref >40)
LDL Cholesterol: 133 mg/dL — ABNORMAL HIGH (ref 0–99)
Total CHOL/HDL Ratio: 6.2 RATIO
Triglycerides: 172 mg/dL — ABNORMAL HIGH (ref <150)
VLDL: 34 mg/dL (ref 0–40)

## 2020-02-20 ENCOUNTER — Ambulatory Visit: Payer: Self-pay | Admitting: Physician Assistant

## 2020-02-20 ENCOUNTER — Encounter: Payer: Self-pay | Admitting: Physician Assistant

## 2020-02-20 DIAGNOSIS — I10 Essential (primary) hypertension: Secondary | ICD-10-CM

## 2020-02-20 DIAGNOSIS — F39 Unspecified mood [affective] disorder: Secondary | ICD-10-CM

## 2020-02-20 DIAGNOSIS — E785 Hyperlipidemia, unspecified: Secondary | ICD-10-CM

## 2020-02-20 DIAGNOSIS — Z79899 Other long term (current) drug therapy: Secondary | ICD-10-CM

## 2020-02-20 MED ORDER — METOPROLOL TARTRATE 50 MG PO TABS
50.0000 mg | ORAL_TABLET | Freq: Two times a day (BID) | ORAL | 6 refills | Status: DC
Start: 2020-02-20 — End: 2021-01-03

## 2020-02-20 MED ORDER — ATORVASTATIN CALCIUM 10 MG PO TABS
10.0000 mg | ORAL_TABLET | Freq: Every day | ORAL | 4 refills | Status: DC
Start: 2020-02-20 — End: 2020-02-25

## 2020-02-20 NOTE — Progress Notes (Signed)
There were no vitals taken for this visit.   Subjective:    Patient ID: Rita Browning, female    DOB: 1980-05-14, 39 y.o.   MRN: [id number]  HPI: Rita Browning is a 39 y.o. female presenting on 02/20/2020 for No chief complaint on file.   HPI   This is a telemedicine appointment through [employer] due to coronavirus pandemic.  I connected with  Rita Browning on 02/20/20 by a video enabled telemedicine application and verified that I am speaking with the correct person using two identifiers.   I discussed the limitations of evaluation and management by telemedicine. The patient expressed understanding and agreed to proceed.  Pt is at home.  Provider is at office.    Pt is 1yoF with HTN, dyslipidemia, and mood disorder.    Pt still going to [facility]  She got both her covid vaccinations and her influenza vaccination  She says she is doing well and has no complaints today.     Relevant past medical, surgical, family and social history reviewed and updated as indicated. Interim medical history since our last visit reviewed. Allergies and medications reviewed and updated.   Current Outpatient Medications:    albuterol (VENTOLIN HFA) 108 (90 Base) MCG/ACT inhaler, Inhale 2 puffs into the lungs every 6 (six) hours as needed for wheezing or shortness of breath., Disp: 6.7 g, Rfl: 3   atorvastatin (LIPITOR) 10 MG tablet, Take 1 tablet (10 mg total) by mouth daily., Disp: 30 tablet, Rfl: 4   busPIRone (BUSPAR) 15 MG tablet, Take 15 mg by mouth 4 (four) times daily. , Disp: , Rfl:    clonazePAM (KLONOPIN) 1 MG tablet, Take 0.5 mg by mouth as needed for anxiety., Disp: , Rfl:    diclofenac (VOLTAREN) 75 MG EC tablet, Take 1 tablet (75 mg total) by mouth 2 (two) times daily., Disp: 60 tablet, Rfl: 0   EFFEXOR XR 75 MG 24 hr capsule, Take 75 mg by mouth 2 (two) times daily., Disp: , Rfl:    hydrochlorothiazide (MICROZIDE) 12.5 MG capsule, Take 1 capsule (12.5 mg total) by mouth daily.,  Disp: 30 capsule, Rfl: 4   hydrOXYzine (ATARAX/VISTARIL) 50 MG tablet, Take 50 mg by mouth at bedtime as needed. , Disp: , Rfl:    metoprolol tartrate (LOPRESSOR) 50 MG tablet, Take 1 tablet by mouth twice daily, Disp: 60 tablet, Rfl: 1   Oxcarbazepine (TRILEPTAL) 300 MG tablet, Take 600 mg by mouth 3 (three) times daily. , Disp: , Rfl:    prazosin (MINIPRESS) 2 MG capsule, Take 1 capsule (2 mg total) by mouth at bedtime. For nightmares (Patient taking differently: Take 2 mg by mouth daily. For nightmares), Disp: 30 capsule, Rfl: 0   risperiDONE (RISPERDAL) 2 MG tablet, Take 2 mg by mouth at bedtime., Disp: , Rfl:    cyclobenzaprine (FLEXERIL) 10 MG tablet, Take 1 tablet (10 mg total) by mouth 3 (three) times daily as needed for muscle spasms. (Patient not taking: Reported on 02/20/2020), Disp: 30 tablet, Rfl: 0   ibuprofen (ADVIL,MOTRIN) 200 MG tablet, Take 800 mg by mouth every 6 (six) hours as needed. (Patient not taking: Reported on 02/20/2020), Disp: , Rfl:      Review of Systems  Per HPI unless specifically indicated above     Objective:    There were no vitals taken for this visit.  Wt Readings from Last 3 Encounters:  01/09/20 (!) 330 lb 6.4 oz (149.9 kg)  12/22/19 (!) 330 lb (149.7 kg)  11/21/19 (!) 329 lb (149.2 kg)    Physical Exam Constitutional:      General: She is not in acute distress. HENT:     Head: Normocephalic and atraumatic.  Pulmonary:     Effort: No respiratory distress.  Neurological:     Mental Status: She is alert and oriented to person, place, and time.  Psychiatric:        Attention and Perception: Attention normal.        Speech: Speech normal.        Behavior: Behavior is cooperative.     Results for orders placed or performed during the hospital encounter of 02/16/20  Lipid panel  Result Value Ref Range   Cholesterol 199 0 - 200 mg/dL   Triglycerides 172 (H) <150 mg/dL   HDL 32 (L) >40 mg/dL   Total CHOL/HDL Ratio 6.2 RATIO   VLDL  34 0 - 40 mg/dL   LDL Cholesterol 133 (H) 0 - 99 mg/dL  Comprehensive metabolic panel  Result Value Ref Range   Sodium 139 135 - 145 mmol/L   Potassium 4.2 3.5 - 5.1 mmol/L   Chloride 103 98 - 111 mmol/L   CO2 27 22 - 32 mmol/L   Glucose, Bld 107 (H) 70 - 99 mg/dL   BUN 11 6 - 20 mg/dL   Creatinine, Ser 0.77 0.44 - 1.00 mg/dL   Calcium 9.2 8.9 - 10.3 mg/dL   Total Protein 7.2 6.5 - 8.1 g/dL   Albumin 3.8 3.5 - 5.0 g/dL   AST 21 15 - 41 U/L   ALT 26 0 - 44 U/L   Alkaline Phosphatase 57 38 - 126 U/L   Total Bilirubin 0.6 0.3 - 1.2 mg/dL   GFR, Estimated >60 >60 mL/min   Anion gap 9 5 - 15      Assessment & Plan:    Encounter Diagnoses  Name Primary?   Essential hypertension Yes   Hyperlipidemia, unspecified hyperlipidemia type    Mood disorder ([place])    Polypharmacy    Morbid obesity ([place])      -Reviewed labs with pt -Counseled on lowfat diet -pt to continue current medications -pt to Follow up in office in 3 months.  She is to contact office sooner prn

## 2020-02-25 ENCOUNTER — Other Ambulatory Visit: Payer: Self-pay | Admitting: Physician Assistant

## 2020-03-13 ENCOUNTER — Other Ambulatory Visit: Payer: Self-pay | Admitting: Physician Assistant

## 2020-03-13 MED ORDER — HYDROCHLOROTHIAZIDE 12.5 MG PO CAPS
12.5000 mg | ORAL_CAPSULE | Freq: Every day | ORAL | 4 refills | Status: AC
Start: 2020-03-13 — End: ?

## 2020-05-16 ENCOUNTER — Other Ambulatory Visit: Payer: Self-pay | Admitting: Physician Assistant

## 2020-05-16 DIAGNOSIS — I10 Essential (primary) hypertension: Secondary | ICD-10-CM

## 2020-05-16 DIAGNOSIS — E785 Hyperlipidemia, unspecified: Secondary | ICD-10-CM

## 2020-05-28 ENCOUNTER — Encounter: Payer: Self-pay | Admitting: Physician Assistant

## 2020-05-28 ENCOUNTER — Ambulatory Visit: Payer: Self-pay | Admitting: Physician Assistant

## 2020-05-28 VITALS — BP 130/84

## 2020-05-28 DIAGNOSIS — F39 Unspecified mood [affective] disorder: Secondary | ICD-10-CM

## 2020-05-28 DIAGNOSIS — Z79899 Other long term (current) drug therapy: Secondary | ICD-10-CM

## 2020-05-28 DIAGNOSIS — E669 Obesity, unspecified: Secondary | ICD-10-CM

## 2020-05-28 DIAGNOSIS — E785 Hyperlipidemia, unspecified: Secondary | ICD-10-CM

## 2020-05-28 DIAGNOSIS — R079 Chest pain, unspecified: Secondary | ICD-10-CM

## 2020-05-28 DIAGNOSIS — Z5320 Procedure and treatment not carried out because of patient's decision for unspecified reasons: Secondary | ICD-10-CM

## 2020-05-28 DIAGNOSIS — I10 Essential (primary) hypertension: Secondary | ICD-10-CM

## 2020-05-28 MED ORDER — OMEPRAZOLE 40 MG PO CPDR: 40.0000 mg | DELAYED_RELEASE_CAPSULE | Freq: Every day | ORAL | 3 refills | Status: DC

## 2020-05-28 NOTE — Progress Notes (Unsigned)
BP 130/84    Subjective:    Patient ID: Rita Browning, female    DOB: [birth year], 40 y.o.   MRN: [id number]  HPI: Rita Browning is a 40 y.o. female presenting on 05/28/2020 for No chief complaint on file.   HPI  This is a telemedicine appointment through [employer] due to coronavirus pandemic.  I connected with  Amarilis Belflower on 05/28/20 by a video enabled telemedicine application and verified that I am speaking with the correct person using two identifiers.   I discussed the limitations of evaluation and management by telemedicine. The patient expressed understanding and agreed to proceed.  Pt is at home.  Provider is at office.       Pt is 40yo with HTN, dyslipidemia and MH issues.   she goes to [facility] for [facility] issues.    She is taking some allergy medicine which she says is helping her allergy symptoms.  Pt monitors her bp at home.  She says the bp Runs 130 most of the time  She is having some CP that she thinks is anxiety.  ths CP can last hours or minutes.  She had episode last week with pain going up into left jaw and left arm tingling.   She says she quit going to ER for CP because they always told her it was anxiety.   The Last time she went to er was 03/30/2017  Her father had some heart disease in his 22s.  Her mother has afib.  PGM had MI in her 54s or 90s.  Pt has never been seen by a cardiologist.  Her EKG is 10/25/2017 was normal.   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   She forgot to get labs drawn.  She got both covid vaccination.  She has not gotten booster.           Relevant past medical, surgical, family and social history reviewed and updated as indicated. Interim medical history since our last visit reviewed. Allergies and medications reviewed and updated.   Current Outpatient Medications:  .  albuterol (VENTOLIN HFA) 108 (90 Base) MCG/ACT inhaler, Inhale 2 puffs into the lungs every 6 (six) hours as needed for wheezing or shortness of  breath., Disp: 6.7 g, Rfl: 3 .  atorvastatin (LIPITOR) 10 MG tablet, Take 1 tablet by mouth once daily, Disp: 30 tablet, Rfl: 4 .  busPIRone (BUSPAR) 15 MG tablet, Take 15 mg by mouth 4 (four) times daily. , Disp: , Rfl:  .  clonazePAM (KLONOPIN) 1 MG tablet, Take 0.5 mg by mouth as needed for anxiety., Disp: , Rfl:  .  diclofenac (VOLTAREN) 75 MG EC tablet, Take 1 tablet (75 mg total) by mouth 2 (two) times daily., Disp: 60 tablet, Rfl: 0 .  EFFEXOR XR 75 MG 24 hr capsule, Take 75 mg by mouth 2 (two) times daily., Disp: , Rfl:  .  hydrochlorothiazide (MICROZIDE) 12.5 MG capsule, Take 1 capsule (12.5 mg total) by mouth daily., Disp: 30 capsule, Rfl: 4 .  hydrOXYzine (ATARAX/VISTARIL) 50 MG tablet, Take 50 mg by mouth at bedtime as needed. , Disp: , Rfl:  .  ibuprofen (ADVIL,MOTRIN) 200 MG tablet, Take 800 mg by mouth every 6 (six) hours as needed., Disp: , Rfl:  .  metoprolol tartrate (LOPRESSOR) 50 MG tablet, Take 1 tablet (50 mg total) by mouth 2 (two) times daily., Disp: 60 tablet, Rfl: 6 .  omeprazole (PRILOSEC OTC) 20 MG tablet, Take 20 mg by mouth daily., Disp: ,  Rfl:  .  Oxcarbazepine (TRILEPTAL) 300 MG tablet, Take 600 mg by mouth 3 (three) times daily. , Disp: , Rfl:  .  prazosin (MINIPRESS) 2 MG capsule, Take 1 capsule (2 mg total) by mouth at bedtime. For nightmares (Patient taking differently: Take 2 mg by mouth daily. For nightmares), Disp: 30 capsule, Rfl: 0 .  risperiDONE (RISPERDAL) 2 MG tablet, Take 2 mg by mouth at bedtime., Disp: , Rfl:  .  cyclobenzaprine (FLEXERIL) 10 MG tablet, Take 1 tablet (10 mg total) by mouth 3 (three) times daily as needed for muscle spasms. (Patient not taking: No sig reported), Disp: 30 tablet, Rfl: 0       Review of Systems  Per HPI unless specifically indicated above     Objective:    BP 130/84   Wt Readings from Last 3 Encounters:  01/09/20 (!) 330 lb 6.4 oz (149.9 kg)  12/22/19 (!) 330 lb (149.7 kg)  11/21/19 (!) 329 lb (149.2 kg)     Physical Exam Constitutional:      General: She is not in acute distress.    Appearance: She is obese. She is not ill-appearing.  HENT:     Head: Normocephalic and atraumatic.  Pulmonary:     Effort: No respiratory distress.  Neurological:     Mental Status: She is alert and oriented to person, place, and time.  Psychiatric:        Attention and Perception: Attention normal.        Speech: Speech normal.        Behavior: Behavior normal. Behavior is cooperative.            Assessment & Plan:     Encounter Diagnoses  Name Primary?  . Essential hypertension Yes  . Hyperlipidemia, unspecified hyperlipidemia type   . Mood disorder ([place])   . Polypharmacy   . Chest pain, unspecified type   . Obesity, unspecified classification, unspecified obesity type, unspecified whether serious comorbidity present   . Screening mammography declined      -pt has htn, dyslipidemia, obesity, previous smoking and family history heart problems.  She has had normal and abnormal ekg.  Due to these and her current symtpoms, it it recommended to Refer to cardiologist for further evaluation and testing.  pt declines.  She says she isn't eligible for cafa because her wife makes too much money.   -Recommend she start asa 81mg  daily.   She is already taking metoprolol.  She is urged to go to ER if her CP returns  -Will call in rx omeprazole to [facility] as rx is less expensive than the OTC -pt to Get fasting labs.  She will be called with results -pt Declines mammogram -pt is encouraged to get covid booster -pt to follow up 3 months.  She is to contact office sooner prn

## 2020-06-27 ENCOUNTER — Encounter (HOSPITAL_COMMUNITY): Payer: Self-pay | Admitting: Emergency Medicine

## 2020-06-27 ENCOUNTER — Emergency Department (HOSPITAL_COMMUNITY): Payer: Self-pay

## 2020-06-27 ENCOUNTER — Emergency Department (HOSPITAL_COMMUNITY)
Admission: EM | Admit: 2020-06-27 | Discharge: 2020-06-27 | Discharge status: Against Medical Advice | Payer: Self-pay | Attending: Emergency Medicine | Admitting: Emergency Medicine

## 2020-06-27 ENCOUNTER — Other Ambulatory Visit: Payer: Self-pay

## 2020-06-27 DIAGNOSIS — I10 Essential (primary) hypertension: Secondary | ICD-10-CM | POA: Insufficient documentation

## 2020-06-27 DIAGNOSIS — R110 Nausea: Secondary | ICD-10-CM | POA: Insufficient documentation

## 2020-06-27 DIAGNOSIS — J45909 Unspecified asthma, uncomplicated: Secondary | ICD-10-CM | POA: Insufficient documentation

## 2020-06-27 DIAGNOSIS — M545 Low back pain, unspecified: Secondary | ICD-10-CM | POA: Insufficient documentation

## 2020-06-27 DIAGNOSIS — R072 Precordial pain: Secondary | ICD-10-CM | POA: Insufficient documentation

## 2020-06-27 DIAGNOSIS — Z79899 Other long term (current) drug therapy: Secondary | ICD-10-CM | POA: Insufficient documentation

## 2020-06-27 DIAGNOSIS — R079 Chest pain, unspecified: Secondary | ICD-10-CM

## 2020-06-27 DIAGNOSIS — R0602 Shortness of breath: Secondary | ICD-10-CM | POA: Insufficient documentation

## 2020-06-27 DIAGNOSIS — M542 Cervicalgia: Secondary | ICD-10-CM | POA: Insufficient documentation

## 2020-06-27 DIAGNOSIS — R739 Hyperglycemia, unspecified: Secondary | ICD-10-CM

## 2020-06-27 DIAGNOSIS — Z87891 Personal history of nicotine dependence: Secondary | ICD-10-CM | POA: Insufficient documentation

## 2020-06-27 LAB — CBC WITH DIFFERENTIAL/PLATELET
Abs Immature Granulocytes: 0.07 10*3/uL (ref 0.00–0.07)
Basophils Absolute: 0 10*3/uL (ref 0.0–0.1)
Basophils Relative: 1 %
Eosinophils Absolute: 0.3 10*3/uL (ref 0.0–0.5)
Eosinophils Relative: 3 %
HCT: 32.6 % — ABNORMAL LOW (ref 36.0–46.0)
Hemoglobin: 10.7 g/dL — ABNORMAL LOW (ref 12.0–15.0)
Immature Granulocytes: 1 %
Lymphocytes Relative: 29 %
Lymphs Abs: 2.2 10*3/uL (ref 0.7–4.0)
MCH: 29.3 pg (ref 26.0–34.0)
MCHC: 32.8 g/dL (ref 30.0–36.0)
MCV: 89.3 fL (ref 80.0–100.0)
Monocytes Absolute: 0.6 10*3/uL (ref 0.1–1.0)
Monocytes Relative: 8 %
Neutro Abs: 4.3 10*3/uL (ref 1.7–7.7)
Neutrophils Relative %: 58 %
Platelets: 268 10*3/uL (ref 150–400)
RBC: 3.65 MIL/uL — ABNORMAL LOW (ref 3.87–5.11)
RDW: 14.9 % (ref 11.5–15.5)
WBC: 7.5 10*3/uL (ref 4.0–10.5)
nRBC: 0 % (ref 0.0–0.2)

## 2020-06-27 LAB — LIPASE, BLOOD: Lipase: 36 U/L (ref 11–51)

## 2020-06-27 LAB — D-DIMER, QUANTITATIVE: D-Dimer, Quant: 0.45 ug/mL-FEU (ref 0.00–0.50)

## 2020-06-27 LAB — TROPONIN I (HIGH SENSITIVITY)
Troponin I (High Sensitivity): 5 ng/L (ref <18)
Troponin I (High Sensitivity): 4 ng/L (ref <18)

## 2020-06-27 LAB — COMPREHENSIVE METABOLIC PANEL
ALT: 39 U/L (ref 0–44)
AST: 29 U/L (ref 15–41)
Albumin: 3.5 g/dL (ref 3.5–5.0)
Alkaline Phosphatase: 57 U/L (ref 38–126)
Anion gap: 7 (ref 5–15)
BUN: 8 mg/dL (ref 6–20)
CO2: 22 mmol/L (ref 22–32)
Calcium: 8.6 mg/dL — ABNORMAL LOW (ref 8.9–10.3)
Chloride: 105 mmol/L (ref 98–111)
Creatinine, Ser: 0.77 mg/dL (ref 0.44–1.00)
GFR, Estimated: >60 mL/min (ref >60)
Glucose, Bld: 267 mg/dL — ABNORMAL HIGH (ref 70–99)
Potassium: 3.8 mmol/L (ref 3.5–5.1)
Sodium: 134 mmol/L — ABNORMAL LOW (ref 135–145)
Total Bilirubin: 0.4 mg/dL (ref 0.3–1.2)
Total Protein: 6.8 g/dL (ref 6.5–8.1)

## 2020-06-27 LAB — CBG MONITORING, ED: Glucose-Capillary: 168 mg/dL — ABNORMAL HIGH (ref 70–99)

## 2020-06-27 MED ORDER — NITROGLYCERIN 0.4 MG SL SUBL
0.4000 mg | SUBLINGUAL_TABLET | Freq: Once | SUBLINGUAL | Status: AC
  Administered 2020-06-27: 0.4 mg via SUBLINGUAL
  Filled 2020-06-27: qty 1

## 2020-06-27 MED ORDER — LIDOCAINE VISCOUS HCL 2 % MT SOLN
15.0000 mL | Freq: Once | OROMUCOSAL | Status: AC
  Administered 2020-06-27: 15 mL via ORAL
  Filled 2020-06-27: qty 15

## 2020-06-27 MED ORDER — ALUM & MAG HYDROXIDE-SIMETH 200-200-20 MG/5ML PO SUSP
30.0000 mL | Freq: Once | ORAL | Status: AC
  Administered 2020-06-27: 30 mL via ORAL
  Filled 2020-06-27: qty 30

## 2020-06-27 MED ORDER — ASPIRIN 81 MG PO CHEW
324.0000 mg | CHEWABLE_TABLET | Freq: Once | ORAL | Status: AC
  Administered 2020-06-27: 324 mg via ORAL
  Filled 2020-06-27: qty 4

## 2020-06-27 NOTE — ED Provider Notes (Signed)
[facility] EMERGENCY DEPARTMENT Provider Note   CSN: [id number] Arrival date & time: 06/27/20  0019     History Chief Complaint  Patient presents with  . Chest Pain    Rita Browning is a 40 y.o. female.  Patient presents with central chest pain onset while she was sitting at the table resting.  Pain has been ongoing for the past 90 minutes. Described as pressure on center of chest. Pain radiates from her left chest to her left arm, upper back, and left neck.  Associated with nausea and shortness of breath but no vomiting.  Did have some mild diaphoresis at home.  Similar episode of chest pain about 1 month ago that is not evaluated.  Denies any previous cardiac history but was told that she had a heart attack in the past.  Does have a history of acid reflux but this feels different.  She denies any birth control use.  The pain feels like a pressure and a sharp stabbing pain to her left chest that goes to her arm and upper back and left neck.  There are shortness of breath, diaphoresis and nausea.  She has never had a stress test or catheterization. No abdominal pain.  No cough or fever.  The history is provided by the patient.  Chest Pain Associated symptoms: back pain, nausea and shortness of breath   Associated symptoms: no abdominal pain, no dizziness, no fever, no headache, no vomiting and no weakness        Past Medical History:  Diagnosis Date  . Anemia   . Anxiety   . Asthma   . Bipolar disorder ([place])   . Blood clot in vein right leg  . Borderline personality disorder ([place])   . Depression   . GERD (gastroesophageal reflux disease)   . Hyperlipidemia   . Hypertension   . PTSD (post-traumatic stress disorder)   . PTSD (post-traumatic stress disorder)     Patient Active Problem List   Diagnosis Date Noted  . Essential hypertension 11/12/2017  . Hyperlipidemia 11/12/2017  . 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 Bipolar  II disorder ([place]) 06/09/2017    Past Surgical History:  Procedure Laterality Date  . blood clot removed from right leg    . CHOLECYSTECTOMY    . ORIF ANKLE FRACTURE Right      OB History   No obstetric history on file.     Family History  Problem Relation Age of Onset  . Diabetes Mother   . Hypertension Mother   . Cancer Mother        cervical  . Kidney disease Mother   . Neuropathy Mother   . Diabetes Father   . Hypertension Father   . Heart disease Father        enlarge heart  . Cancer Sister        cervical  . Anxiety disorder Sister   . Seizures Maternal Grandmother   . Hypertension Paternal Grandmother   . Stroke Paternal Grandmother   . Heart disease Paternal Grandmother   . Heart attack Paternal Grandmother     Social History   Tobacco Use  . Smoking status: Former Smoker    Types: E-cigarettes, Cigarettes    Quit date: 04/2018    Years since quitting: 2.1  . Smokeless tobacco: Never Used  . Tobacco comment: currently vaping  Vaping Use  . Vaping Use: Every day  Substance Use Topics  . Alcohol use: Yes  Comment: 1-2 drinks/months  . Drug use: No    Home Medications Prior to Admission medications   Medication Sig Start Date End Date Taking? Authorizing Provider  albuterol (VENTOLIN HFA) 108 (90 Base) MCG/ACT inhaler Inhale 2 puffs into the lungs every 6 (six) hours as needed for wheezing or shortness of breath. 01/26/19   Soyla Dryer, PA-C  atorvastatin (LIPITOR) 10 MG tablet Take 1 tablet by mouth once daily 02/25/20   Soyla Dryer, PA-C  busPIRone (BUSPAR) 15 MG tablet Take 15 mg by mouth 4 (four) times daily.     [provider]  clonazePAM (KLONOPIN) 1 MG tablet Take 0.5 mg by mouth as needed for anxiety.    [provider]  cyclobenzaprine (FLEXERIL) 10 MG tablet Take 1 tablet (10 mg total) by mouth 3 (three) times daily as needed for muscle spasms. Patient not taking: No sig reported 11/21/19   Soyla Dryer, PA-C   diclofenac (VOLTAREN) 75 MG EC tablet Take 1 tablet (75 mg total) by mouth 2 (two) times daily. 11/21/19   Soyla Dryer, PA-C  EFFEXOR XR 75 MG 24 hr capsule Take 75 mg by mouth 2 (two) times daily. 11/30/19   [provider]  hydrochlorothiazide (MICROZIDE) 12.5 MG capsule Take 1 capsule (12.5 mg total) by mouth daily. 03/13/20   Soyla Dryer, PA-C  hydrOXYzine (ATARAX/VISTARIL) 50 MG tablet Take 50 mg by mouth at bedtime as needed.     [provider]  ibuprofen (ADVIL,MOTRIN) 200 MG tablet Take 800 mg by mouth every 6 (six) hours as needed.    [provider]  metoprolol tartrate (LOPRESSOR) 50 MG tablet Take 1 tablet (50 mg total) by mouth 2 (two) times daily. 02/20/20   Soyla Dryer, PA-C  omeprazole (PRILOSEC OTC) 20 MG tablet Take 20 mg by mouth daily.    [provider]  omeprazole (PRILOSEC) 40 MG capsule Take 1 capsule (40 mg total) by mouth daily. 05/28/20   Soyla Dryer, PA-C  Oxcarbazepine (TRILEPTAL) 300 MG tablet Take 600 mg by mouth 3 (three) times daily.     [provider]  prazosin (MINIPRESS) 2 MG capsule Take 1 capsule (2 mg total) by mouth at bedtime. For nightmares Patient taking differently: Take 2 mg by mouth daily. For nightmares 06/12/17   Money, Lowry Ram, FNP  risperiDONE (RISPERDAL) 2 MG tablet Take 2 mg by mouth at bedtime.    [provider]    Allergies    Codeine  Review of Systems   Review of Systems  Constitutional: Negative for activity change, appetite change and fever.  HENT: Negative for congestion and rhinorrhea.   Respiratory: Positive for chest tightness and shortness of breath.   Cardiovascular: Positive for chest pain.  Gastrointestinal: Positive for nausea. Negative for abdominal pain and vomiting.  Genitourinary: Negative for dysuria and hematuria.  Musculoskeletal: Positive for back pain.  Skin: Negative for pallor and rash.  Neurological: Negative for dizziness, weakness and  headaches.   all other systems are negative except as noted in the HPI and PMH.    Physical Exam Updated Vital Signs BP (!) 144/94   Pulse 85   Temp 98 F (36.7 C) (Oral)   Resp (!) 21   Ht 5\' 8"  (1.727 m)   Wt (!) 171.5 kg   LMP 06/11/2020   SpO2 98%   BMI 57.47 kg/m   Physical Exam Vitals and nursing note reviewed.  Constitutional:      General: She is not in acute distress.  Appearance: She is well-developed and well-nourished. She is obese.  HENT:     Head: Normocephalic and atraumatic.     Mouth/Throat:     Mouth: Oropharynx is clear and moist.     Pharynx: No oropharyngeal exudate.  Eyes:     Extraocular Movements: EOM normal.     Conjunctiva/sclera: Conjunctivae normal.     Pupils: Pupils are equal, round, and reactive to light.  Neck:     Comments: No meningismus. Cardiovascular:     Rate and Rhythm: Normal rate and regular rhythm.     Pulses: Intact distal pulses.     Heart sounds: Normal heart sounds. No murmur heard.   Pulmonary:     Effort: Pulmonary effort is normal. No respiratory distress.     Breath sounds: Normal breath sounds.     Comments: TTP L chest wall Chest:     Chest wall: Tenderness present.  Abdominal:     Palpations: Abdomen is soft.     Tenderness: There is no abdominal tenderness. There is no guarding or rebound.  Musculoskeletal:        General: No tenderness or edema. Normal range of motion.     Cervical back: Normal range of motion and neck supple.  Skin:    General: Skin is warm.  Neurological:     Mental Status: She is alert and oriented to person, place, and time.     Cranial Nerves: No cranial nerve deficit.     Motor: No abnormal muscle tone.     Coordination: Coordination normal.     Comments:  5/5 strength throughout. CN 2-12 intact.Equal grip strength.   Psychiatric:        Mood and Affect: Mood and affect normal.        Behavior: Behavior normal.     ED Results / Procedures / Treatments   Labs (all labs  ordered are listed, but only abnormal results are displayed) Labs Reviewed  CBC WITH DIFFERENTIAL/PLATELET - Abnormal; Notable for the following components:      Result Value   RBC 3.65 (*)    Hemoglobin 10.7 (*)    HCT 32.6 (*)    All other components within normal limits  COMPREHENSIVE METABOLIC PANEL - Abnormal; Notable for the following components:   Sodium 134 (*)    Glucose, Bld 267 (*)    Calcium 8.6 (*)    All other components within normal limits  CBG MONITORING, ED - Abnormal; Notable for the following components:   Glucose-Capillary 168 (*)    All other components within normal limits  LIPASE, BLOOD  D-DIMER, QUANTITATIVE  TROPONIN I (HIGH SENSITIVITY)  TROPONIN I (HIGH SENSITIVITY)    EKG EKG Interpretation  Date/Time:  Wednesday June 27 2020 00:22:18 EST Ventricular Rate:  88 PR Interval:  140 QRS Duration: 90 QT Interval:  394 QTC Calculation: 476 R Axis:   52 Text Interpretation: Normal sinus rhythm Normal ECG No significant change was found Confirmed by Ezequiel Essex [phone]) on 06/27/2020 12:41:41 AM   Radiology DG Chest Portable 1 View  Result Date: 06/27/2020 CLINICAL DATA:  Chest pain EXAM: PORTABLE CHEST 1 VIEW COMPARISON:  March 30, 2017 FINDINGS: The heart size and mediastinal contours are within normal limits. Both lungs are clear. The visualized skeletal structures are unremarkable. IMPRESSION: No active disease. Electronically Signed   By: Prudencio Pair M.D.   On: 06/27/2020 01:30    Procedures Procedures   Medications Ordered in ED Medications  aspirin chewable tablet 324 mg (  has no administration in time range)  nitroGLYCERIN (NITROSTAT) SL tablet 0.4 mg (has no administration in time range)  alum & mag hydroxide-simeth (MAALOX/MYLANTA) 200-200-20 MG/5ML suspension 30 mL (has no administration in time range)    And  lidocaine (XYLOCAINE) 2 % viscous mouth solution 15 mL (has no administration in time range)    ED Course  I have  reviewed the triage vital signs and the nursing notes.  Pertinent labs & imaging results that were available during my care of the patient were reviewed by me and considered in my medical decision making (see chart for details).    MDM Rules/Calculators/A&P                         Central chest pain going to arm and neck onset about 90 minutes ago associated with shortness of breath, nausea and diaphoresis.  EKG shows unchanged T wave inversion in lead III.  Aspirin, nitroglycerin and GI cocktail given.  Heart score is 3-4.  First troponin is negative.  D-dimer is negative.  Low suspicion for aortic dissection or pulmonary embolism.  Blood sugar 267.  Patient informed of this.  She reports no history of diabetes.  Pain has improved after aspirin, nitroglycerin and GI cocktail. Troponin negative x2. D-dimer is negative. Hemoglobin 10.7 which is down from 11.9 in 2019. Blood sugar is improved to 168.  Advised patient of hyperglycemia and need for follow-up.  No evidence of DKA.  Chest pain has resolved.  Troponin negative x2.  Results discussed with patient. Admission versus discharge discussed with patient. she is adamant that she wants to go home.  Discussed that she is ruled out for MI but has not ruled out for heart disease.  Her chest pain description is concerning for possible coronary artery disease and she needs to follow-up with cardiology for stress test.  She declines admission for cardiology evaluation later today. She appears to have capacity to leave [place].  She is adamant that she wants to go home.  She appears to have capacity to decline admission. states she will return if the chest pain recurs.  Advised to return if the chest pain becomes exertional, associated with shortness of breath, nausea, vomiting, diaphoresis, any other concerns.   Final Clinical Impression(s) / ED Diagnoses Final diagnoses:  Precordial pain  Nonspecific chest pain    Rx / DC  Orders ED Discharge Orders    None       Nycere Presley, Annie Main, MD 06/27/20 [phone]

## 2020-06-27 NOTE — ED Notes (Signed)
Pt placed on cardiac monitor with BP to set cycle every 30 minutes. Continuous pulse oximeter applied.  

## 2020-06-27 NOTE — Discharge Instructions (Addendum)
Your testing today shows no heart attack but there is still concern you could have heart disease. Your blood sugar today was elevated and you should followup with your doctor regarding this.  You declined to be admitted to the hospital today.  You should follow-up with a cardiologist for a stress test.  Return to the ED if your chest pain becomes exertional, associated with shortness of breath, nausea, vomiting, sweating, any other concerns.

## 2020-06-27 NOTE — ED Triage Notes (Signed)
Pt c/o chest pain with jaw pain and radiates to upper back x one hour.

## 2020-08-16 ENCOUNTER — Other Ambulatory Visit: Payer: Self-pay | Admitting: Physician Assistant

## 2020-08-16 DIAGNOSIS — I10 Essential (primary) hypertension: Secondary | ICD-10-CM

## 2020-08-16 DIAGNOSIS — E785 Hyperlipidemia, unspecified: Secondary | ICD-10-CM

## 2020-08-27 ENCOUNTER — Ambulatory Visit: Payer: Self-pay | Admitting: Physician Assistant

## 2020-09-03 ENCOUNTER — Ambulatory Visit: Payer: Self-pay | Admitting: Physician Assistant

## 2020-09-21 ENCOUNTER — Other Ambulatory Visit: Payer: Self-pay

## 2020-09-21 ENCOUNTER — Other Ambulatory Visit (HOSPITAL_COMMUNITY)
Admission: RE | Admit: 2020-09-21 | Discharge: 2020-09-21 | Disposition: A | Discharge status: Home / Self Care | Payer: Self-pay | Source: Ambulatory Visit | Attending: Physician Assistant | Admitting: Physician Assistant

## 2020-09-21 DIAGNOSIS — I10 Essential (primary) hypertension: Secondary | ICD-10-CM | POA: Insufficient documentation

## 2020-09-21 DIAGNOSIS — E785 Hyperlipidemia, unspecified: Secondary | ICD-10-CM | POA: Insufficient documentation

## 2020-09-21 LAB — COMPREHENSIVE METABOLIC PANEL
ALT: 48 U/L — ABNORMAL HIGH (ref 0–44)
AST: 53 U/L — ABNORMAL HIGH (ref 15–41)
Albumin: 3.5 g/dL (ref 3.5–5.0)
Alkaline Phosphatase: 64 U/L (ref 38–126)
Anion gap: 6 (ref 5–15)
BUN: 11 mg/dL (ref 6–20)
CO2: 26 mmol/L (ref 22–32)
Calcium: 9.2 mg/dL (ref 8.9–10.3)
Chloride: 106 mmol/L (ref 98–111)
Creatinine, Ser: 0.79 mg/dL (ref 0.44–1.00)
GFR, Estimated: >60 mL/min (ref >60)
Glucose, Bld: 149 mg/dL — ABNORMAL HIGH (ref 70–99)
Potassium: 4 mmol/L (ref 3.5–5.1)
Sodium: 138 mmol/L (ref 135–145)
Total Bilirubin: 0.5 mg/dL (ref 0.3–1.2)
Total Protein: 7 g/dL (ref 6.5–8.1)

## 2020-09-21 LAB — LIPID PANEL
Cholesterol: 161 mg/dL (ref 0–200)
HDL: 35 mg/dL — ABNORMAL LOW (ref >40)
LDL Cholesterol: 89 mg/dL (ref 0–99)
Total CHOL/HDL Ratio: 4.6 RATIO
Triglycerides: 183 mg/dL — ABNORMAL HIGH (ref <150)
VLDL: 37 mg/dL (ref 0–40)

## 2020-09-21 LAB — TSH: TSH: 4.352 u[IU]/mL (ref 0.350–4.500)

## 2020-09-25 ENCOUNTER — Encounter: Payer: Self-pay | Admitting: Physician Assistant

## 2020-09-25 ENCOUNTER — Other Ambulatory Visit: Payer: Self-pay

## 2020-09-25 ENCOUNTER — Ambulatory Visit: Payer: Self-pay | Admitting: Physician Assistant

## 2020-09-25 VITALS — BP 134/83 | HR 98 | Temp 98.0°F | Wt 392.5 lb

## 2020-09-25 DIAGNOSIS — F39 Unspecified mood [affective] disorder: Secondary | ICD-10-CM

## 2020-09-25 DIAGNOSIS — R7303 Prediabetes: Secondary | ICD-10-CM

## 2020-09-25 DIAGNOSIS — Z79899 Other long term (current) drug therapy: Secondary | ICD-10-CM

## 2020-09-25 DIAGNOSIS — E785 Hyperlipidemia, unspecified: Secondary | ICD-10-CM

## 2020-09-25 DIAGNOSIS — J449 Chronic obstructive pulmonary disease, unspecified: Secondary | ICD-10-CM

## 2020-09-25 DIAGNOSIS — I10 Essential (primary) hypertension: Secondary | ICD-10-CM

## 2020-09-25 MED ORDER — ASMANEX HFA 100 MCG/ACT IN AERO: 1.0000 | INHALATION_SPRAY | Freq: Two times a day (BID) | RESPIRATORY_TRACT | 0 refills | Status: AC

## 2020-09-25 NOTE — Progress Notes (Signed)
BP 134/83   Pulse 98   Temp 98 F (36.7 C)   Wt (!) 392 lb 8 oz (178 kg)   SpO2 97%   BMI 59.68 kg/m    Subjective:    Patient ID: Rita Browning, female    DOB: 07/01/1980, 40 y.o.   MRN: [id number]  HPI: Rita Browning is a 40 y.o. female presenting on 09/25/2020 for Hypertension and Hyperlipidemia   HPI    Pt had a negative covid 19 screening questionnaire.  Chief Complaint  Patient presents with  . Hypertension  . Hyperlipidemia    Pt says she Uses her inhaler maybe once/week.    She says her breathing "sucks".  She says she gets very winded with even the mildest exertion.  She reports Lots of stress.  She admits to stress eating.   She has not been exercising.  She says her [place] specialist is changing jobs so she is having to locate another.         Relevant past medical, surgical, family and social history reviewed and updated as indicated. Interim medical history since our last visit reviewed. Allergies and medications reviewed and updated.   Current Outpatient Medications:  .  albuterol (VENTOLIN HFA) 108 (90 Base) MCG/ACT inhaler, Inhale 2 puffs into the lungs every 6 (six) hours as needed for wheezing or shortness of breath., Disp: 6.7 g, Rfl: 3 .  atorvastatin (LIPITOR) 10 MG tablet, Take 1 tablet by mouth once daily, Disp: 30 tablet, Rfl: 4 .  busPIRone (BUSPAR) 15 MG tablet, Take 15 mg by mouth 4 (four) times daily. , Disp: , Rfl:  .  clonazePAM (KLONOPIN) 1 MG tablet, Take 0.5 mg by mouth as needed for anxiety., Disp: , Rfl:  .  cyclobenzaprine (FLEXERIL) 10 MG tablet, Take 1 tablet (10 mg total) by mouth 3 (three) times daily as needed for muscle spasms., Disp: 30 tablet, Rfl: 0 .  diclofenac (VOLTAREN) 75 MG EC tablet, Take 1 tablet (75 mg total) by mouth 2 (two) times daily., Disp: 60 tablet, Rfl: 0 .  EFFEXOR XR 75 MG 24 hr capsule, Take 75 mg by mouth 2 (two) times daily., Disp: , Rfl:  .  hydrochlorothiazide (MICROZIDE) 12.5 MG capsule, Take 1 capsule  (12.5 mg total) by mouth daily., Disp: 30 capsule, Rfl: 4 .  hydrOXYzine (ATARAX/VISTARIL) 50 MG tablet, Take 50 mg by mouth at bedtime as needed. , Disp: , Rfl:  .  ibuprofen (ADVIL,MOTRIN) 200 MG tablet, Take 800 mg by mouth every 6 (six) hours as needed., Disp: , Rfl:  .  metoprolol tartrate (LOPRESSOR) 50 MG tablet, Take 1 tablet (50 mg total) by mouth 2 (two) times daily., Disp: 60 tablet, Rfl: 6 .  omeprazole (PRILOSEC) 40 MG capsule, Take 1 capsule (40 mg total) by mouth daily., Disp: 30 capsule, Rfl: 3 .  Oxcarbazepine (TRILEPTAL) 300 MG tablet, Take 600 mg by mouth 3 (three) times daily. , Disp: , Rfl:  .  prazosin (MINIPRESS) 2 MG capsule, Take 1 capsule (2 mg total) by mouth at bedtime. For nightmares (Patient taking differently: Take 2 mg by mouth daily. For nightmares), Disp: 30 capsule, Rfl: 0 .  risperiDONE (RISPERDAL) 2 MG tablet, Take 2 mg by mouth at bedtime., Disp: , Rfl:     Review of Systems  Per HPI unless specifically indicated above     Objective:    BP 134/83   Pulse 98   Temp 98 F (36.7 C)   Wt (!) 392 lb  8 oz (178 kg)   SpO2 97%   BMI 59.68 kg/m   Wt Readings from Last 3 Encounters:  09/25/20 (!) 392 lb 8 oz (178 kg)  06/27/20 (!) 378 lb (171.5 kg)  01/09/20 (!) 330 lb 6.4 oz (149.9 kg)    Physical Exam Vitals reviewed.  Constitutional:      General: She is not in acute distress.    Appearance: She is well-developed. She is obese.  HENT:     Head: Normocephalic and atraumatic.  Cardiovascular:     Rate and Rhythm: Normal rate and regular rhythm.  Pulmonary:     Effort: Pulmonary effort is normal.     Breath sounds: Normal breath sounds.  Abdominal:     General: Bowel sounds are normal.     Palpations: Abdomen is soft. There is no mass.     Tenderness: There is no abdominal tenderness.  Musculoskeletal:     Cervical back: Neck supple.     Right lower leg: No edema.     Left lower leg: No edema.  Lymphadenopathy:     Cervical: No cervical  adenopathy.  Skin:    General: Skin is warm and dry.  Neurological:     Mental Status: She is alert and oriented to person, place, and time.  Psychiatric:        Behavior: Behavior normal.     Comments: Engages readily.  Pleasant and appropriate.     Results for orders placed or performed during the hospital encounter of 09/21/20  TSH  Result Value Ref Range   TSH 4.352 0.350 - 4.500 uIU/mL  Lipid panel  Result Value Ref Range   Cholesterol 161 0 - 200 mg/dL   Triglycerides 183 (H) <150 mg/dL   HDL 35 (L) >40 mg/dL   Total CHOL/HDL Ratio 4.6 RATIO   VLDL 37 0 - 40 mg/dL   LDL Cholesterol 89 0 - 99 mg/dL  Comprehensive metabolic panel  Result Value Ref Range   Sodium 138 135 - 145 mmol/L   Potassium 4.0 3.5 - 5.1 mmol/L   Chloride 106 98 - 111 mmol/L   CO2 26 22 - 32 mmol/L   Glucose, Bld 149 (H) 70 - 99 mg/dL   BUN 11 6 - 20 mg/dL   Creatinine, Ser 0.79 0.44 - 1.00 mg/dL   Calcium 9.2 8.9 - 10.3 mg/dL   Total Protein 7.0 6.5 - 8.1 g/dL   Albumin 3.5 3.5 - 5.0 g/dL   AST 53 (H) 15 - 41 U/L   ALT 48 (H) 0 - 44 U/L   Alkaline Phosphatase 64 38 - 126 U/L   Total Bilirubin 0.5 0.3 - 1.2 mg/dL   GFR, Estimated >60 >60 mL/min   Anion gap 6 5 - 15      Assessment & Plan:    Encounter Diagnoses  Name Primary?  . Essential hypertension Yes  . Hyperlipidemia, unspecified hyperlipidemia type   . Morbid obesity ([place])   . Mood disorder ([place])   . Polypharmacy   . Prediabetes   . Chronic obstructive pulmonary disease, unspecified COPD type ([place])      -Reviewed labs with pt -Add maintenance inhaler. She is given sample of asmanex. She is encouraged to Use her rescue inhaler when needed. -She was given [employer] application for advair -she is encouarged to Exercise regularly to help with stress and her weight -discussed her elevated glucose and prediabetes.  She will work on diet and exercise and declines to get a1c tested  today -pt to follow up 3 months.  will check a1c at  that time.  Pt is to contact office sooner prn

## 2020-11-08 ENCOUNTER — Other Ambulatory Visit: Payer: Self-pay | Admitting: Physician Assistant

## 2020-11-12 ENCOUNTER — Other Ambulatory Visit: Payer: Self-pay | Admitting: Physician Assistant

## 2020-11-12 MED ORDER — OMEPRAZOLE 40 MG PO CPDR: 40.0000 mg | DELAYED_RELEASE_CAPSULE | Freq: Every day | ORAL | 6 refills | Status: AC

## 2020-12-04 ENCOUNTER — Encounter (HOSPITAL_COMMUNITY): Payer: Self-pay | Admitting: Emergency Medicine

## 2020-12-04 ENCOUNTER — Emergency Department (HOSPITAL_COMMUNITY): Payer: Self-pay

## 2020-12-04 ENCOUNTER — Emergency Department (HOSPITAL_COMMUNITY)
Admission: EM | Admit: 2020-12-04 | Discharge: 2020-12-05 | Disposition: A | Discharge status: Home / Self Care | Payer: Self-pay | Attending: Emergency Medicine | Admitting: Emergency Medicine

## 2020-12-04 ENCOUNTER — Other Ambulatory Visit: Payer: Self-pay

## 2020-12-04 DIAGNOSIS — I10 Essential (primary) hypertension: Secondary | ICD-10-CM | POA: Insufficient documentation

## 2020-12-04 DIAGNOSIS — Z87891 Personal history of nicotine dependence: Secondary | ICD-10-CM | POA: Insufficient documentation

## 2020-12-04 DIAGNOSIS — J4521 Mild intermittent asthma with (acute) exacerbation: Secondary | ICD-10-CM | POA: Insufficient documentation

## 2020-12-04 DIAGNOSIS — Z79899 Other long term (current) drug therapy: Secondary | ICD-10-CM | POA: Insufficient documentation

## 2020-12-04 MED ORDER — IPRATROPIUM-ALBUTEROL 0.5-2.5 (3) MG/3ML IN SOLN
3.0000 mL | Freq: Once | RESPIRATORY_TRACT | Status: AC
  Administered 2020-12-04: 3 mL via RESPIRATORY_TRACT
  Filled 2020-12-04: qty 3

## 2020-12-04 MED ORDER — ALBUTEROL SULFATE HFA 108 (90 BASE) MCG/ACT IN AERS
2.0000 | INHALATION_SPRAY | RESPIRATORY_TRACT | Status: DC | PRN
  Filled 2020-12-04: qty 6.7

## 2020-12-04 NOTE — ED Notes (Signed)
12/04/2020 Pt placed on cardiac monitor with BP to set cycle every 30 minutes. Continuous pulse oximeter applied.

## 2020-12-04 NOTE — ED Notes (Signed)
RT Notified of orders.

## 2020-12-04 NOTE — ED Triage Notes (Signed)
Pt was pale and sob, wheezing in all fields. EMS gave pt 10 albuterol and '125mg'$  of solumedrol.

## 2020-12-05 MED ORDER — PREDNISONE 20 MG PO TABS: 40.0000 mg | ORAL_TABLET | Freq: Every day | ORAL | 0 refills | Status: DC

## 2020-12-05 NOTE — Discharge Instructions (Addendum)
You were seen today for your asthma.  Take prednisone for the next 5 days.  Continue inhaler every 2-4 hours as needed.  Of note, you had fairly significantly elevated blood pressures.  Take your blood pressure medications as prescribed and follow-up closely with your primary physician for blood pressure recheck and medication adjustments.

## 2020-12-05 NOTE — ED Provider Notes (Signed)
[facility] EMERGENCY DEPARTMENT Provider Note   CSN: [id number] Arrival date & time: 12/04/20  2032     History Chief Complaint  Patient presents with   Shortness of Breath    Rita Browning is a 40 y.o. female.  HPI     This a 40 year old female with a history of asthma, bipolar disorder, hypertension, hyperlipidemia, morbid obesity who presents with shortness of breath.  Patient reports that she developed a coughing fit at home.  After that she was unable to catch her breath.  She took her inhaler with minimal relief.  Upon EMS arrival she was noted to have wheezing in all lung fields.  She was given albuterol and Solu-Medrol.  On my evaluation, she is without complaint.  She states she feels much better and wants to go home.  She denies any hospitalizations for asthma.  Does not remember the last time she had an asthma exacerbation.  No recurrent triggers.  Denies any recent fevers or upper respiratory illnesses.  Denies chest pain or ongoing shortness of breath.  Past Medical History:  Diagnosis Date   Anemia    Anxiety    Asthma    Bipolar disorder ([place])    Blood clot in vein right leg   Borderline personality disorder ([place])    Depression    GERD (gastroesophageal reflux disease)    Hyperlipidemia    Hypertension    PTSD (post-traumatic stress disorder)    PTSD (post-traumatic stress disorder)     Patient Active Problem List   Diagnosis Date Noted   Essential hypertension 11/12/2017   Hyperlipidemia 11/12/2017   MDD (major depressive disorder), recurrent severe, without psychosis ([place]) 10/22/2017   Bipolar II disorder ([place]) 06/09/2017    Past Surgical History:  Procedure Laterality Date   blood clot removed from right leg     CHOLECYSTECTOMY     ORIF ANKLE FRACTURE Right      OB History   No obstetric history on file.     Family History  Problem Relation Age of Onset   Diabetes Mother    Hypertension Mother    Cancer Mother        cervical   Kidney  disease Mother    Neuropathy Mother    Diabetes Father    Hypertension Father    Heart disease Father        enlarge heart   Cancer Sister        cervical   Anxiety disorder Sister    Seizures Maternal Grandmother    Hypertension Paternal Grandmother    Stroke Paternal Grandmother    Heart disease Paternal Grandmother    Heart attack Paternal Grandmother     Social History   Tobacco Use   Smoking status: Former    Types: E-cigarettes, Cigarettes    Quit date: 04/2018    Years since quitting: 2.6   Smokeless tobacco: Never   Tobacco comments:    currently vaping  Vaping Use   Vaping Use: Every day  Substance Use Topics   Alcohol use: Yes    Comment: 1-2 drinks/months   Drug use: No    Home Medications Prior to Admission medications   Medication Sig Start Date End Date Taking? Authorizing Provider  predniSONE (DELTASONE) 20 MG tablet Take 2 tablets (40 mg total) by mouth daily. 12/05/20  Yes Genessa Beman, Barbette Hair, MD  albuterol (VENTOLIN HFA) 108 (90 Base) MCG/ACT inhaler Inhale 2 puffs into the lungs every 6 (six) hours as needed for wheezing  or shortness of breath. 01/26/19   Soyla Dryer, PA-C  atorvastatin (LIPITOR) 10 MG tablet Take 1 tablet by mouth once daily 02/25/20   Soyla Dryer, PA-C  busPIRone (BUSPAR) 15 MG tablet Take 15 mg by mouth 4 (four) times daily.     [provider]  clonazePAM (KLONOPIN) 1 MG tablet Take 0.5 mg by mouth as needed for anxiety.    [provider]  cyclobenzaprine (FLEXERIL) 10 MG tablet Take 1 tablet by mouth three times daily as needed for muscle spasm 11/08/20   Soyla Dryer, PA-C  diclofenac (VOLTAREN) 75 MG EC tablet Take 1 tablet (75 mg total) by mouth 2 (two) times daily. 11/21/19   Soyla Dryer, PA-C  EFFEXOR XR 75 MG 24 hr capsule Take 75 mg by mouth 2 (two) times daily. 11/30/19   [provider]  hydrochlorothiazide (MICROZIDE) 12.5 MG capsule Take 1 capsule (12.5 mg total) by mouth daily.  03/13/20   Soyla Dryer, PA-C  hydrOXYzine (ATARAX/VISTARIL) 50 MG tablet Take 50 mg by mouth at bedtime as needed.     [provider]  ibuprofen (ADVIL,MOTRIN) 200 MG tablet Take 800 mg by mouth every 6 (six) hours as needed.    [provider]  metoprolol tartrate (LOPRESSOR) 50 MG tablet Take 1 tablet (50 mg total) by mouth 2 (two) times daily. 02/20/20   Soyla Dryer, PA-C  Mometasone Furoate [facility] HFA) 100 MCG/ACT AERO Inhale 1 puff into the lungs in the morning and at bedtime. 09/25/20   Soyla Dryer, PA-C  omeprazole (PRILOSEC) 40 MG capsule Take 1 capsule (40 mg total) by mouth daily. 11/12/20   Soyla Dryer, PA-C  Oxcarbazepine (TRILEPTAL) 300 MG tablet Take 600 mg by mouth 3 (three) times daily.     [provider]  prazosin (MINIPRESS) 2 MG capsule Take 1 capsule (2 mg total) by mouth at bedtime. For nightmares Patient taking differently: Take 2 mg by mouth daily. For nightmares 06/12/17   Money, Lowry Ram, FNP  risperiDONE (RISPERDAL) 2 MG tablet Take 2 mg by mouth at bedtime.    [provider]    Allergies    Codeine  Review of Systems   Review of Systems  Constitutional:  Negative for fever.  Respiratory:  Positive for cough and shortness of breath.   Cardiovascular:  Negative for chest pain.  Gastrointestinal:  Negative for abdominal pain, nausea and vomiting.  Genitourinary:  Negative for dysuria.  All other systems reviewed and are negative.  Physical Exam Updated Vital Signs BP (!) 174/123   Pulse (!) 116   Temp 98.8 F (37.1 C) (Oral)   Resp 20   Ht 1.727 m ('5\' 8"'$ )   Wt (!) 177.8 kg   LMP 11/05/2020   SpO2 95%   BMI 59.60 kg/m   Physical Exam Vitals and nursing note reviewed.  Constitutional:      Appearance: She is well-developed. She is obese. She is not ill-appearing.  HENT:     Head: Normocephalic and atraumatic.     Mouth/Throat:     Mouth: Mucous membranes are moist.  Eyes:     Pupils: Pupils  are equal, round, and reactive to light.  Cardiovascular:     Rate and Rhythm: Regular rhythm. Tachycardia present.     Heart sounds: Normal heart sounds.  Pulmonary:     Effort: Pulmonary effort is normal. No respiratory distress.     Breath sounds: No wheezing.     Comments: Speaking in full sentences, and occasional  expiratory wheeze with fair air movement Abdominal:     General: Bowel sounds are normal.     Palpations: Abdomen is soft.  Musculoskeletal:     Cervical back: Neck supple.     Right lower leg: No edema.     Left lower leg: No edema.  Skin:    General: Skin is warm and dry.  Neurological:     Mental Status: She is alert and oriented to person, place, and time.  Psychiatric:        Mood and Affect: Mood normal.    ED Results / Procedures / Treatments   Labs (all labs ordered are listed, but only abnormal results are displayed) Labs Reviewed - No data to display  EKG EKG Interpretation  Date/Time:  Tuesday December 04 2020 20:37:55 EDT Ventricular Rate:  126 PR Interval:  146 QRS Duration: 84 QT Interval:  311 QTC Calculation: 451 R Axis:   72 Text Interpretation: Sinus tachycardia Low voltage, precordial leads Borderline repolarization abnormality Confirmed by Thayer Jew [phone]) on 12/04/2020 10:59:23 PM  Radiology DG Chest 2 View  Result Date: 12/04/2020 CLINICAL DATA:  Shortness of breath.  Wheezing. EXAM: CHEST - 2 VIEW COMPARISON:  Chest radiograph 06/27/2020 FINDINGS: Upper normal heart size. Stable mediastinal contours. There is diffuse peribronchial thickening. No focal airspace disease. No pleural fluid or pneumothorax. No acute osseous abnormalities are seen. IMPRESSION: Diffuse peribronchial thickening consistent with asthma or bronchitis. Electronically Signed   By: Keith Rake M.D.   On: 12/04/2020 21:42    Procedures Procedures   Medications Ordered in ED Medications  albuterol (VENTOLIN HFA) 108 (90 Base) MCG/ACT inhaler 2 puff (has  no administration in time range)  ipratropium-albuterol (DUONEB) 0.5-2.5 (3) MG/3ML nebulizer solution 3 mL (3 mLs Nebulization Given 12/04/20 2347)    ED Course  I have reviewed the triage vital signs and the nursing notes.  Pertinent labs & imaging results that were available during my care of the patient were reviewed by me and considered in my medical decision making (see chart for details).    MDM Rules/Calculators/A&P                           Patient presents with shortness of breath and wheezing.  She received albuterol and Solu-Medrol by EMS.  Reports significant provement and wants to go home.  She is nontoxic-appearing.  Vital signs notable for elevated blood pressures in the 180s over 100s.  She has a history of hypertension.  She has no other complaints related to this.  She does still have an occasional wheeze.  She states she continues to vape.  Patient was given an additional DuoNeb.  She ambulated and maintained her pulse ox 94 to 95%.  Presentation consistent with asthma.  Chest x-ray shows no evidence of pneumothorax or pneumonia and is consistent with asthma.  Feel she can be discharged home with prednisone and continued inhaler use.  Was advised to follow-up closely with her primary physician regarding her ongoing high blood pressure.  No signs of hypertensive urgency or emergency at this time.  Take medications as prescribed.  After history, exam, and medical workup I feel the patient has been appropriately medically screened and is safe for discharge home. Pertinent diagnoses were discussed with the patient. Patient was given return precautions.  Final Clinical Impression(s) / ED Diagnoses Final diagnoses:  Mild intermittent asthma with exacerbation  Primary hypertension    Rx / DC Orders ED  Discharge Orders          Ordered    predniSONE (DELTASONE) 20 MG tablet  Daily        12/05/20 0036             Merryl Hacker, MD 12/05/20 [phone]

## 2020-12-05 NOTE — ED Notes (Signed)
Ambulated pt 174f sats maintained 94-95%, pt tolerated well. Pt states "I'm ready to go home".

## 2020-12-13 ENCOUNTER — Other Ambulatory Visit: Payer: Self-pay | Admitting: Physician Assistant

## 2020-12-13 DIAGNOSIS — R7303 Prediabetes: Secondary | ICD-10-CM

## 2020-12-13 DIAGNOSIS — E785 Hyperlipidemia, unspecified: Secondary | ICD-10-CM

## 2020-12-13 DIAGNOSIS — I10 Essential (primary) hypertension: Secondary | ICD-10-CM

## 2020-12-26 ENCOUNTER — Ambulatory Visit: Payer: Self-pay | Admitting: Physician Assistant

## 2021-01-03 ENCOUNTER — Encounter: Payer: Self-pay | Admitting: Physician Assistant

## 2021-01-03 ENCOUNTER — Ambulatory Visit: Payer: Self-pay | Admitting: Physician Assistant

## 2021-01-03 DIAGNOSIS — E785 Hyperlipidemia, unspecified: Secondary | ICD-10-CM

## 2021-01-03 DIAGNOSIS — Z20822 Contact with and (suspected) exposure to covid-19: Secondary | ICD-10-CM

## 2021-01-03 DIAGNOSIS — R7303 Prediabetes: Secondary | ICD-10-CM

## 2021-01-03 DIAGNOSIS — Z79899 Other long term (current) drug therapy: Secondary | ICD-10-CM

## 2021-01-03 DIAGNOSIS — J449 Chronic obstructive pulmonary disease, unspecified: Secondary | ICD-10-CM

## 2021-01-03 DIAGNOSIS — F39 Unspecified mood [affective] disorder: Secondary | ICD-10-CM

## 2021-01-03 DIAGNOSIS — J329 Chronic sinusitis, unspecified: Secondary | ICD-10-CM

## 2021-01-03 DIAGNOSIS — I10 Essential (primary) hypertension: Secondary | ICD-10-CM

## 2021-01-03 DIAGNOSIS — E669 Obesity, unspecified: Secondary | ICD-10-CM

## 2021-01-03 MED ORDER — AMOXICILLIN 500 MG PO CAPS: 500.0000 mg | ORAL_CAPSULE | Freq: Three times a day (TID) | ORAL | 0 refills | Status: AC

## 2021-01-03 MED ORDER — METOPROLOL TARTRATE 50 MG PO TABS: 50.0000 mg | ORAL_TABLET | Freq: Two times a day (BID) | ORAL | 6 refills | Status: AC

## 2021-01-03 NOTE — Progress Notes (Signed)
There were no vitals taken for this visit.   Subjective:    Patient ID: Rita Browning, female    DOB: [birth year], 40 y.o.   MRN: [id number]  HPI: Rita Browning is a 40 y.o. female presenting on 01/03/2021 for No chief complaint on file.   HPI  Pt was scheduled for in-person appointment but it was changed to virtual due to pt is sick.    This is a telemedicine appointment through [employer] due to coronavirus pandemic.  I connected with  Rita Browning on 01/03/21 by a video enabled telemedicine application and verified that I am speaking with the correct person using two identifiers.   I discussed the limitations of evaluation and management by telemedicine. The patient expressed understanding and agreed to proceed.  Pt is in her parked car.  Provider is in office.    Chief Complaint  Patient presents with   Asthma   Hypertension   Hyperlipidemia     Pt is Working at [employer] now.  Her insurance starts next month.  She started feeling bad Tuesday.  She is having a lot of Drainage.  She says "Things tast funny".  She reports a lot of pressure behind her eyes.    Home covid test negative yesterday.  She feels like her Sob is worsening and snoring is worse.  She Wonders about sleep apnea  She Had a bad asthma attack and went to ER last month.   She says that EMS came close to intubating her.  She was not admitted for inpatient care.    She has had No smoking 2 years.  She still vapes but is hoping to stop that.  She is Not sleeping well.    She did not get her labs drawn.      Relevant past medical, surgical, family and social history reviewed and updated as indicated. Interim medical history since our last visit reviewed. Allergies and medications reviewed and updated.    Current Outpatient Medications:    albuterol (VENTOLIN HFA) 108 (90 Base) MCG/ACT inhaler, Inhale 2 puffs into the lungs every 6 (six) hours as needed for wheezing or shortness of breath., Disp: 6.7 g, Rfl: 3    atorvastatin (LIPITOR) 10 MG tablet, Take 1 tablet by mouth once daily, Disp: 30 tablet, Rfl: 4   busPIRone (BUSPAR) 15 MG tablet, Take 15 mg by mouth 4 (four) times daily. , Disp: , Rfl:    clonazePAM (KLONOPIN) 1 MG tablet, Take 0.5 mg by mouth as needed for anxiety., Disp: , Rfl:    cyclobenzaprine (FLEXERIL) 10 MG tablet, Take 1 tablet by mouth three times daily as needed for muscle spasm, Disp: 30 tablet, Rfl: 0   diclofenac (VOLTAREN) 75 MG EC tablet, Take 1 tablet (75 mg total) by mouth 2 (two) times daily., Disp: 60 tablet, Rfl: 0   EFFEXOR XR 75 MG 24 hr capsule, Take 75 mg by mouth 2 (two) times daily., Disp: , Rfl:    hydrOXYzine (ATARAX/VISTARIL) 50 MG tablet, Take 50 mg by mouth at bedtime as needed. , Disp: , Rfl:    ibuprofen (ADVIL,MOTRIN) 200 MG tablet, Take 800 mg by mouth every 6 (six) hours as needed., Disp: , Rfl:    metoprolol tartrate (LOPRESSOR) 50 MG tablet, Take 1 tablet (50 mg total) by mouth 2 (two) times daily., Disp: 60 tablet, Rfl: 6   Mometasone Furoate (ASMANEX HFA) 100 MCG/ACT AERO, Inhale 1 puff into the lungs in the morning and at bedtime., Disp: 13 g, Rfl:  0   omeprazole (PRILOSEC) 40 MG capsule, Take 1 capsule (40 mg total) by mouth daily., Disp: 30 capsule, Rfl: 6   Oxcarbazepine (TRILEPTAL) 300 MG tablet, Take 600 mg by mouth 3 (three) times daily. , Disp: , Rfl:    prazosin (MINIPRESS) 2 MG capsule, Take 1 capsule (2 mg total) by mouth at bedtime. For nightmares (Patient taking differently: Take 2 mg by mouth daily. For nightmares), Disp: 30 capsule, Rfl: 0   risperiDONE (RISPERDAL) 2 MG tablet, Take 2 mg by mouth at bedtime., Disp: , Rfl:    hydrochlorothiazide (MICROZIDE) 12.5 MG capsule, Take 1 capsule (12.5 mg total) by mouth daily. (Patient not taking: Reported on 01/03/2021), Disp: 30 capsule, Rfl: 4    Review of Systems  Per HPI unless specifically indicated above     Objective:    There were no vitals taken for this visit.  Wt Readings from  Last 3 Encounters:  12/04/20 (!) 392 lb (177.8 kg)  09/25/20 (!) 392 lb 8 oz (178 kg)  06/27/20 (!) 378 lb (171.5 kg)    Physical Exam Constitutional:      General: She is not in acute distress.    Appearance: She is not toxic-appearing.  HENT:     Head: Normocephalic and atraumatic.  Pulmonary:     Effort: No respiratory distress.     Comments: Pt is talking in complete sentences without dyspnea Neurological:     Mental Status: She is alert and oriented to person, place, and time.  Psychiatric:        Attention and Perception: Attention normal.        Speech: Speech normal.        Behavior: Behavior normal. Behavior is cooperative.          Assessment & Plan:    Encounter Diagnoses  Name Primary?   Person under investigation for COVID-19 Yes   Chronic obstructive pulmonary disease, unspecified COPD type ([place])    Sinusitis, unspecified chronicity, unspecified location    Essential hypertension    Hyperlipidemia, unspecified hyperlipidemia type    Prediabetes    Polypharmacy    Mood disorder ([place])    Obesity, unspecified classification, unspecified obesity type, unspecified whether serious comorbidity present      -discussed with pt that she needs serial testing for covid if the test reads out as negative.  She will need at least 2 tests but could do a third if 2nd is negative.  Pt states understanding.  Encouraged isolation until tests negative -rx amoxil for sinusitis -pt is given work note to [employer] on Monday.  Discussed that if her covid test is positive, she will be given not to stay out until Thursday 9/15.   -discussed with pt that she needs sleep study to evaluate for OSA.  Since her insurance stars in 3 weeks, there isn't enough time to apply for [employer] financial assistance, get it approved, schedule sleep study and have it done in that time frame.  Discussed with pt that it would like be better to have her new PCP refer her for testing.  Pt is in agreement.   Pt was encouraged to go ahead and get appointment with new PCP so she can get in as soon as possible after her insurance starts. -pt is in agreement with plan.  She is to contact office if worsens or new symptoms or if needed prior to her insurance going into effect

## 2021-01-03 NOTE — Patient Instructions (Signed)
[email or web address]

## 2021-01-22 ENCOUNTER — Other Ambulatory Visit: Payer: Self-pay

## 2021-01-22 ENCOUNTER — Encounter (HOSPITAL_COMMUNITY): Payer: Self-pay | Admitting: *Deleted

## 2021-01-22 ENCOUNTER — Emergency Department (HOSPITAL_COMMUNITY)
Admission: EM | Admit: 2021-01-22 | Discharge: 2021-01-22 | Disposition: A | Discharge status: Home / Self Care | Payer: Self-pay | Attending: Emergency Medicine | Admitting: Emergency Medicine

## 2021-01-22 ENCOUNTER — Emergency Department (HOSPITAL_COMMUNITY): Payer: Self-pay

## 2021-01-22 DIAGNOSIS — Z79899 Other long term (current) drug therapy: Secondary | ICD-10-CM | POA: Insufficient documentation

## 2021-01-22 DIAGNOSIS — R079 Chest pain, unspecified: Secondary | ICD-10-CM

## 2021-01-22 DIAGNOSIS — I10 Essential (primary) hypertension: Secondary | ICD-10-CM | POA: Insufficient documentation

## 2021-01-22 DIAGNOSIS — R61 Generalized hyperhidrosis: Secondary | ICD-10-CM | POA: Insufficient documentation

## 2021-01-22 DIAGNOSIS — R0789 Other chest pain: Secondary | ICD-10-CM | POA: Insufficient documentation

## 2021-01-22 DIAGNOSIS — J45909 Unspecified asthma, uncomplicated: Secondary | ICD-10-CM | POA: Insufficient documentation

## 2021-01-22 DIAGNOSIS — Z87891 Personal history of nicotine dependence: Secondary | ICD-10-CM | POA: Insufficient documentation

## 2021-01-22 DIAGNOSIS — R110 Nausea: Secondary | ICD-10-CM | POA: Insufficient documentation

## 2021-01-22 LAB — BASIC METABOLIC PANEL
Anion gap: 8 (ref 5–15)
BUN: 6 mg/dL (ref 6–20)
CO2: 25 mmol/L (ref 22–32)
Calcium: 8.8 mg/dL — ABNORMAL LOW (ref 8.9–10.3)
Chloride: 104 mmol/L (ref 98–111)
Creatinine, Ser: 0.74 mg/dL (ref 0.44–1.00)
GFR, Estimated: >60 mL/min (ref >60)
Glucose, Bld: 111 mg/dL — ABNORMAL HIGH (ref 70–99)
Potassium: 3.5 mmol/L (ref 3.5–5.1)
Sodium: 137 mmol/L (ref 135–145)

## 2021-01-22 LAB — TROPONIN I (HIGH SENSITIVITY)
Troponin I (High Sensitivity): 6 ng/L (ref <18)
Troponin I (High Sensitivity): 6 ng/L (ref <18)

## 2021-01-22 LAB — CBC
HCT: 36.9 % (ref 36.0–46.0)
Hemoglobin: 11.9 g/dL — ABNORMAL LOW (ref 12.0–15.0)
MCH: 29.2 pg (ref 26.0–34.0)
MCHC: 32.2 g/dL (ref 30.0–36.0)
MCV: 90.7 fL (ref 80.0–100.0)
Platelets: 333 10*3/uL (ref 150–400)
RBC: 4.07 MIL/uL (ref 3.87–5.11)
RDW: 15.2 % (ref 11.5–15.5)
WBC: 7.5 10*3/uL (ref 4.0–10.5)
nRBC: 0 % (ref 0.0–0.2)

## 2021-01-22 LAB — D-DIMER, QUANTITATIVE: D-Dimer, Quant: 0.35 ug/mL-FEU (ref 0.00–0.50)

## 2021-01-22 LAB — CBG MONITORING, ED: Glucose-Capillary: 103 mg/dL — ABNORMAL HIGH (ref 70–99)

## 2021-01-22 MED ORDER — ASPIRIN 81 MG PO CHEW
324.0000 mg | CHEWABLE_TABLET | Freq: Once | ORAL | Status: AC
  Administered 2021-01-22: 324 mg via ORAL
  Filled 2021-01-22: qty 4

## 2021-01-22 MED ORDER — NITROGLYCERIN 0.4 MG SL SUBL
0.4000 mg | SUBLINGUAL_TABLET | SUBLINGUAL | Status: DC | PRN
  Administered 2021-01-22 (×2): 0.4 mg via SUBLINGUAL
  Filled 2021-01-22 (×2): qty 1

## 2021-01-22 NOTE — ED Notes (Signed)
Gave pt discharge paperwork, verbalized understanding. Unable to sign due to pen pad malfunction. Ambulatory at discharge.

## 2021-01-22 NOTE — ED Provider Notes (Signed)
[facility] EMERGENCY DEPARTMENT Provider Note   CSN: [id number] Arrival date & time: 01/22/21  1646     History Chief Complaint  Patient presents with   Chest Pain    Rita Browning is a 40 y.o. female.   Chest Pain  HPI: A 40 year old patient with a history of hypertension, hypercholesterolemia and obesity presents for evaluation of chest pain. Initial onset of pain was more than 6 hours ago. The patient's chest pain is described as heaviness/pressure/tightness, is sharp and is not worse with exertion. The patient complains of nausea and reports some diaphoresis. The patient's chest pain is middle- or left-sided, is not well-localized and does radiate to the arms/jaw/neck. The patient has no history of stroke, has no history of peripheral artery disease, has not smoked in the past 90 days, denies any history of treated diabetes and has no relevant family history of coronary artery disease (first degree relative at less than age 30).   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     Family History  Problem Relation Age of Onset   Diabetes Mother    Hypertension Mother    Cancer Mother        cervical   Kidney disease Mother    Neuropathy  Mother    Diabetes Father    Hypertension Father    Heart disease Father        enlarge heart   Cancer Sister        cervical   Anxiety disorder Sister    Seizures Maternal Grandmother    Hypertension Paternal Grandmother    Stroke Paternal Grandmother    Heart disease Paternal Grandmother    Heart attack Paternal Grandmother     Social History   Tobacco Use   Smoking status: Former    Types: E-cigarettes, Cigarettes    Quit date: 04/2018    Years since quitting: 2.7   Smokeless tobacco: Never   Tobacco comments:    currently vaping  Vaping Use   Vaping Use: Every day  Substance Use Topics   Alcohol use: Yes    Comment: 1-2 drinks/months   Drug use: No    Home Medications Prior to Admission medications   Medication Sig Start Date End Date Taking? Authorizing Provider  albuterol (VENTOLIN HFA) 108 (90 Base) MCG/ACT inhaler Inhale 2 puffs into the lungs every 6 (six) hours as needed for wheezing or shortness of breath. 01/26/19  Yes Soyla Dryer, PA-C  atorvastatin (LIPITOR) 10 MG tablet Take 1 tablet by mouth once daily 02/25/20  Yes Soyla Dryer, PA-C  busPIRone (BUSPAR) 15 MG tablet Take 15 mg by  mouth 4 (four) times daily.    Yes [provider]  clonazePAM (KLONOPIN) 0.5 MG tablet Take 0.5 mg by mouth 2 (two) times daily as needed. 11/24/20  Yes [provider]  clonazePAM (KLONOPIN) 1 MG tablet Take 0.5 mg by mouth as needed for anxiety.   Yes [provider]  diclofenac (VOLTAREN) 75 MG EC tablet Take 1 tablet (75 mg total) by mouth 2 (two) times daily. 11/21/19  Yes Soyla Dryer, PA-C  GEODON 40 MG capsule Take 40 mg by mouth 2 (two) times daily. 01/08/21  Yes [provider]  hydrOXYzine (ATARAX/VISTARIL) 50 MG tablet Take 50 mg by mouth at bedtime as needed.    Yes [provider]  ibuprofen (ADVIL,MOTRIN) 200 MG tablet Take 800 mg by mouth every 6 (six) hours as needed.   Yes [provider]   metoprolol tartrate (LOPRESSOR) 50 MG tablet Take 1 tablet (50 mg total) by mouth 2 (two) times daily. 01/03/21  Yes Soyla Dryer, PA-C  omeprazole (PRILOSEC) 40 MG capsule Take 1 capsule (40 mg total) by mouth daily. 11/12/20  Yes Soyla Dryer, PA-C  Oxcarbazepine (TRILEPTAL) 300 MG tablet Take 600 mg by mouth 3 (three) times daily.    Yes [provider]  prazosin (MINIPRESS) 2 MG capsule Take 1 capsule (2 mg total) by mouth at bedtime. For nightmares Patient taking differently: Take 2 mg by mouth daily. For nightmares 06/12/17  Yes Money, Lowry Ram, FNP  RISPERDAL 1 MG tablet Take 1 mg by mouth daily. 01/08/21  Yes [provider]  venlafaxine XR (EFFEXOR-XR) 150 MG 24 hr capsule Take 150 mg by mouth See admin instructions. 150 mg every morning and 75 mg every evening 01/08/21  Yes [provider]  cyclobenzaprine (FLEXERIL) 10 MG tablet Take 1 tablet by mouth three times daily as needed for muscle spasm Patient not taking: Reported on 01/22/2021 11/08/20   Soyla Dryer, PA-C  EFFEXOR XR 75 MG 24 hr capsule Take 75 mg by mouth 2 (two) times daily. Patient not taking: Reported on 01/22/2021 11/30/19   [provider]  hydrochlorothiazide (MICROZIDE) 12.5 MG capsule Take 1 capsule (12.5 mg total) by mouth daily. Patient not taking: No sig reported 03/13/20   Soyla Dryer, PA-C  Mometasone Furoate [facility] HFA) 100 MCG/ACT AERO Inhale 1 puff into the lungs in the morning and at bedtime. Patient not taking: Reported on 01/22/2021 09/25/20   Soyla Dryer, PA-C  risperiDONE (RISPERDAL) 2 MG tablet Take 2 mg by mouth at bedtime. Patient not taking: Reported on 01/22/2021    [provider]    Allergies    Codeine  Review of Systems   Review of Systems  Cardiovascular:  Positive for chest pain.  All other systems reviewed and are negative.  Physical Exam Updated Vital Signs BP (!) 154/96   Pulse 100   Temp 98.6 F (37 C) (Oral)   Resp 18    Ht 1.727 m (5\' 8" )   Wt (!) 174.6 kg   LMP 01/22/2021   SpO2 98%   BMI 58.54 kg/m   Physical Exam Vitals and nursing note reviewed.  Constitutional:      General: She is not in acute distress.    Appearance: She is well-developed.  HENT:     Head: Normocephalic and atraumatic.     Right Ear: External ear normal.     Left Ear: External ear normal.  Eyes:     General: No scleral icterus.  Right eye: No discharge.        Left eye: No discharge.     Conjunctiva/sclera: Conjunctivae normal.  Neck:     Trachea: No tracheal deviation.  Cardiovascular:     Rate and Rhythm: Normal rate and regular rhythm.  Pulmonary:     Effort: Pulmonary effort is normal. No respiratory distress.     Breath sounds: Normal breath sounds. No stridor. No wheezing or rales.  Abdominal:     General: Bowel sounds are normal. There is no distension.     Palpations: Abdomen is soft.     Tenderness: There is no abdominal tenderness. There is no guarding or rebound.  Musculoskeletal:        General: No tenderness or deformity.     Cervical back: Neck supple.     Right lower leg: No tenderness. No edema.     Left lower leg: No tenderness. No edema.  Skin:    General: Skin is warm and dry.     Findings: No rash.  Neurological:     General: No focal deficit present.     Mental Status: She is alert.     Cranial Nerves: No cranial nerve deficit (no facial droop, extraocular movements intact, no slurred speech).     Sensory: No sensory deficit.     Motor: No abnormal muscle tone or seizure activity.     Coordination: Coordination normal.  Psychiatric:        Mood and Affect: Mood normal.    ED Results / Procedures / Treatments   Labs (all labs ordered are listed, but only abnormal results are displayed) Labs Reviewed  BASIC METABOLIC PANEL - Abnormal; Notable for the following components:      Result Value   Glucose, Bld 111 (*)    Calcium 8.8 (*)    All other components within normal  limits  CBC - Abnormal; Notable for the following components:   Hemoglobin 11.9 (*)    All other components within normal limits  CBG MONITORING, ED - Abnormal; Notable for the following components:   Glucose-Capillary 103 (*)    All other components within normal limits  D-DIMER, QUANTITATIVE  TROPONIN I (HIGH SENSITIVITY)  TROPONIN I (HIGH SENSITIVITY)    EKG EKG Interpretation  Date/Time:  Tuesday January 22 2021 16:54:19 EDT Ventricular Rate:  104 PR Interval:  120 QRS Duration: 94 QT Interval:  366 QTC Calculation: 481 R Axis:   39 Text Interpretation: Sinus tachycardia Otherwise normal ECG No significant change since last tracing Confirmed by Dorie Rank [id number]) on 01/22/2021 4:57:31 PM  Radiology DG Chest 2 View  Result Date: 01/22/2021 CLINICAL DATA:  Chest pain since this morning. EXAM: CHEST - 2 VIEW COMPARISON:  12/04/2020 FINDINGS: The cardiac silhouette, mediastinal and hilar contours are normal. The lungs are clear. No pleural effusions. No pulmonary lesions. The bony thorax is intact. IMPRESSION: No acute cardiopulmonary findings. Electronically Signed   By: Marijo Sanes M.D.   On: 01/22/2021 18:45    Procedures Procedures   Medications Ordered in ED Medications  nitroGLYCERIN (NITROSTAT) SL tablet 0.4 mg (0.4 mg Sublingual Given 01/22/21 1943)  aspirin chewable tablet 324 mg (324 mg Oral Given 01/22/21 1724)    ED Course  I have reviewed the triage vital signs and the nursing notes.  Pertinent labs & imaging results that were available during my care of the patient were reviewed by me and considered in my medical decision making (see chart for details).    MDM  Rules/Calculators/A&P HEAR Score: 4                         Patient presented with chest pain.  No history of heart disease.  Moderate heart score.  Serial troponins are normal.  EKG is reassuring.  I doubt symptoms are related to acute coronary syndrome.  No signs of DVT.  D-dimer negative.   Doubt PE  Chest x-ray without signs of pneumonia or pneumothorax.  Patient does have history of anxiety.  Its possible symptoms could be related to that.  At this time ED work-up is reassuring.  She appears stable for discharge outpatient follow-up Final Clinical Impression(s) / ED Diagnoses Final diagnoses:  Chest pain, unspecified type    Rx / DC Orders ED Discharge Orders     None        Dorie Rank, MD 01/22/21 2132

## 2021-01-22 NOTE — ED Triage Notes (Signed)
Chest pain onset this am. 

## 2021-01-22 NOTE — ED Notes (Signed)
Patient transported to X-ray 

## 2021-01-22 NOTE — Discharge Instructions (Addendum)
The tests today were reassuring.  No signs of pneumonia, heart attack or blood clot. Take over the counter medications as needed for pain.  Follow up with your doctor for further evaluation.

## 2021-02-07 NOTE — [facility] (Signed)
Scheduled upcoming appointment with [facility] for 10.19.22 at 10am at [facility] office ([address], [place])

## 2022-01-26 IMAGING — DX DG CHEST 2V
2 series · 2 of 2 positions shown · non-contrast
Comparison: Chest radiograph 06/27/2020

CLINICAL DATA: Shortness of breath.  Wheezing.

EXAM:
CHEST - 2 VIEW

[chest lat]
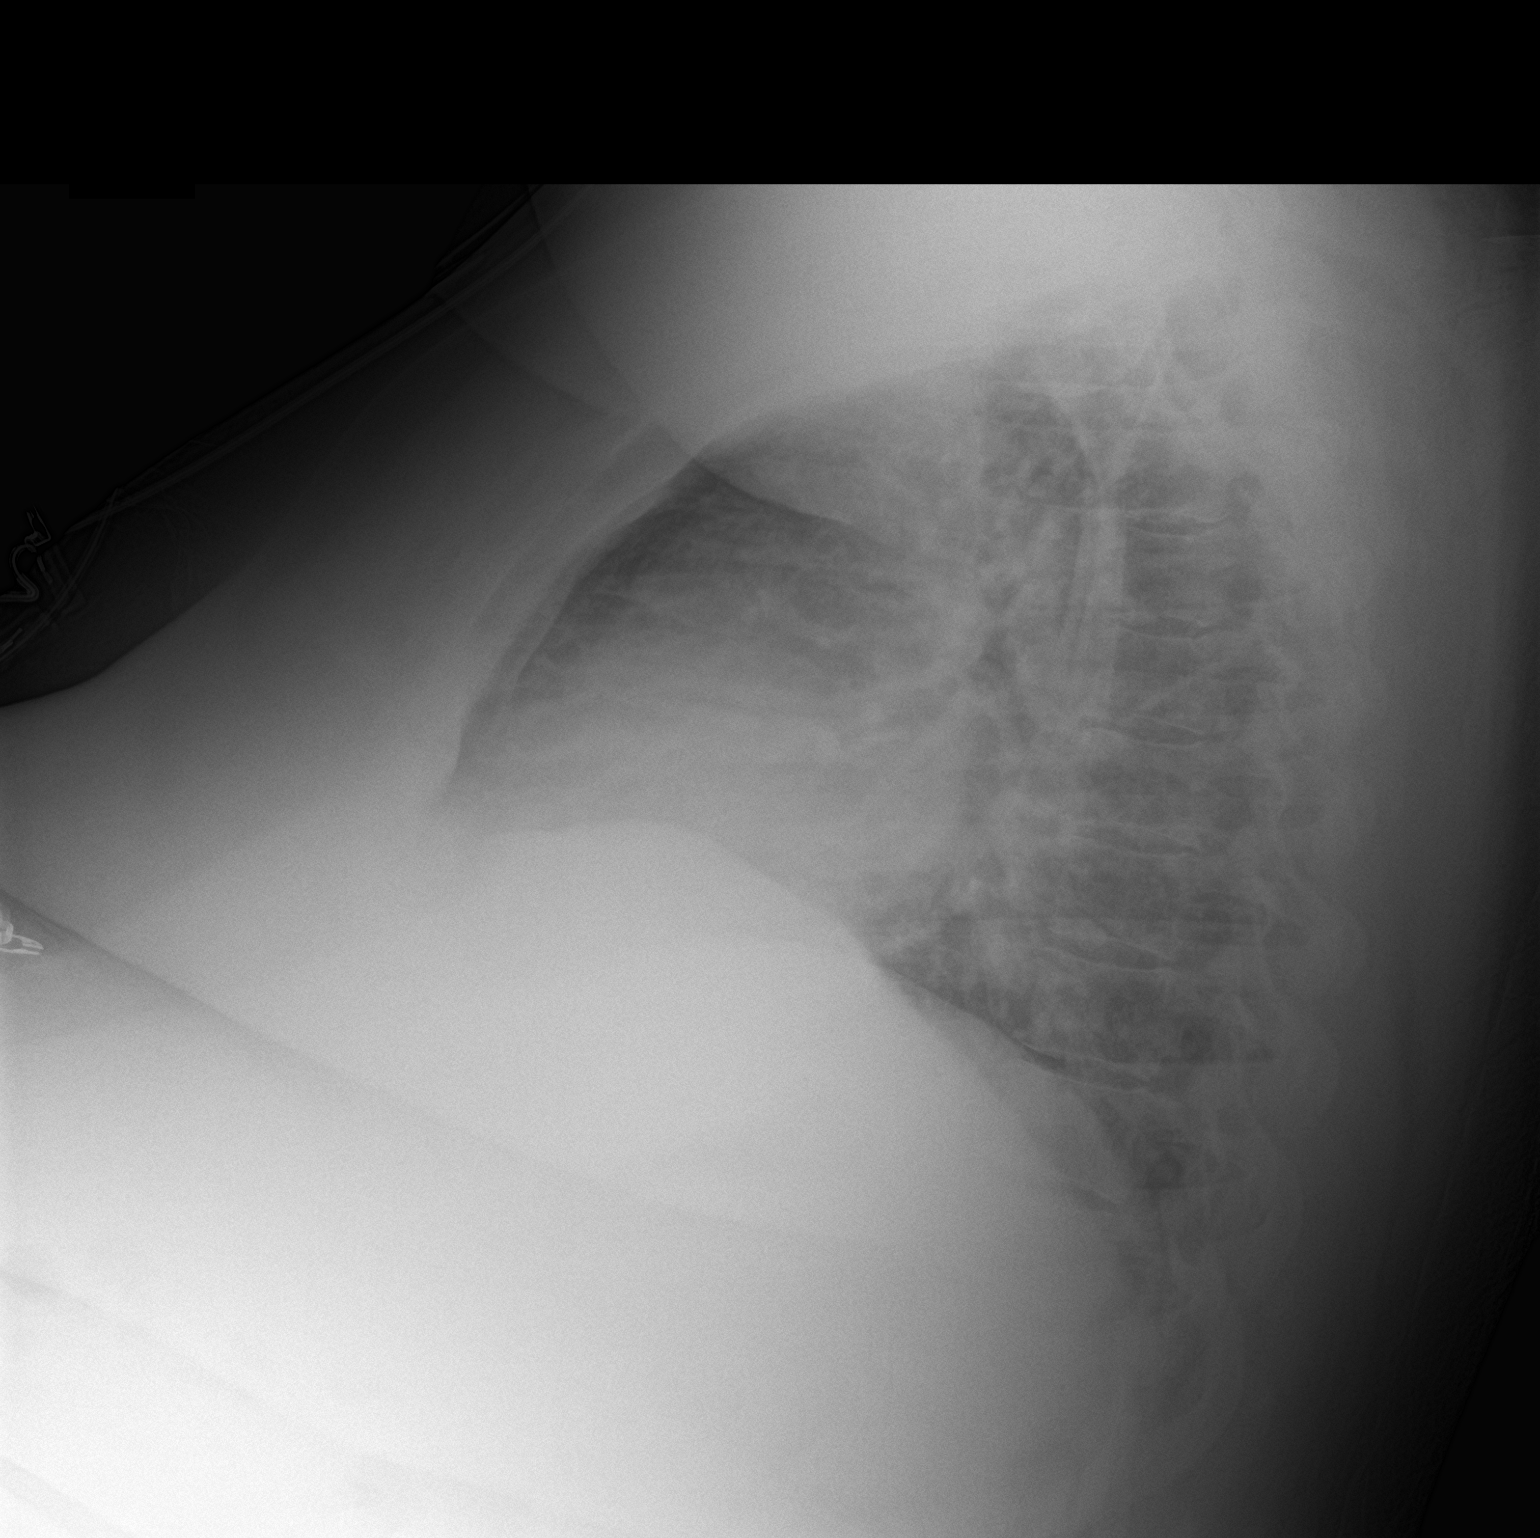

[chest ap]
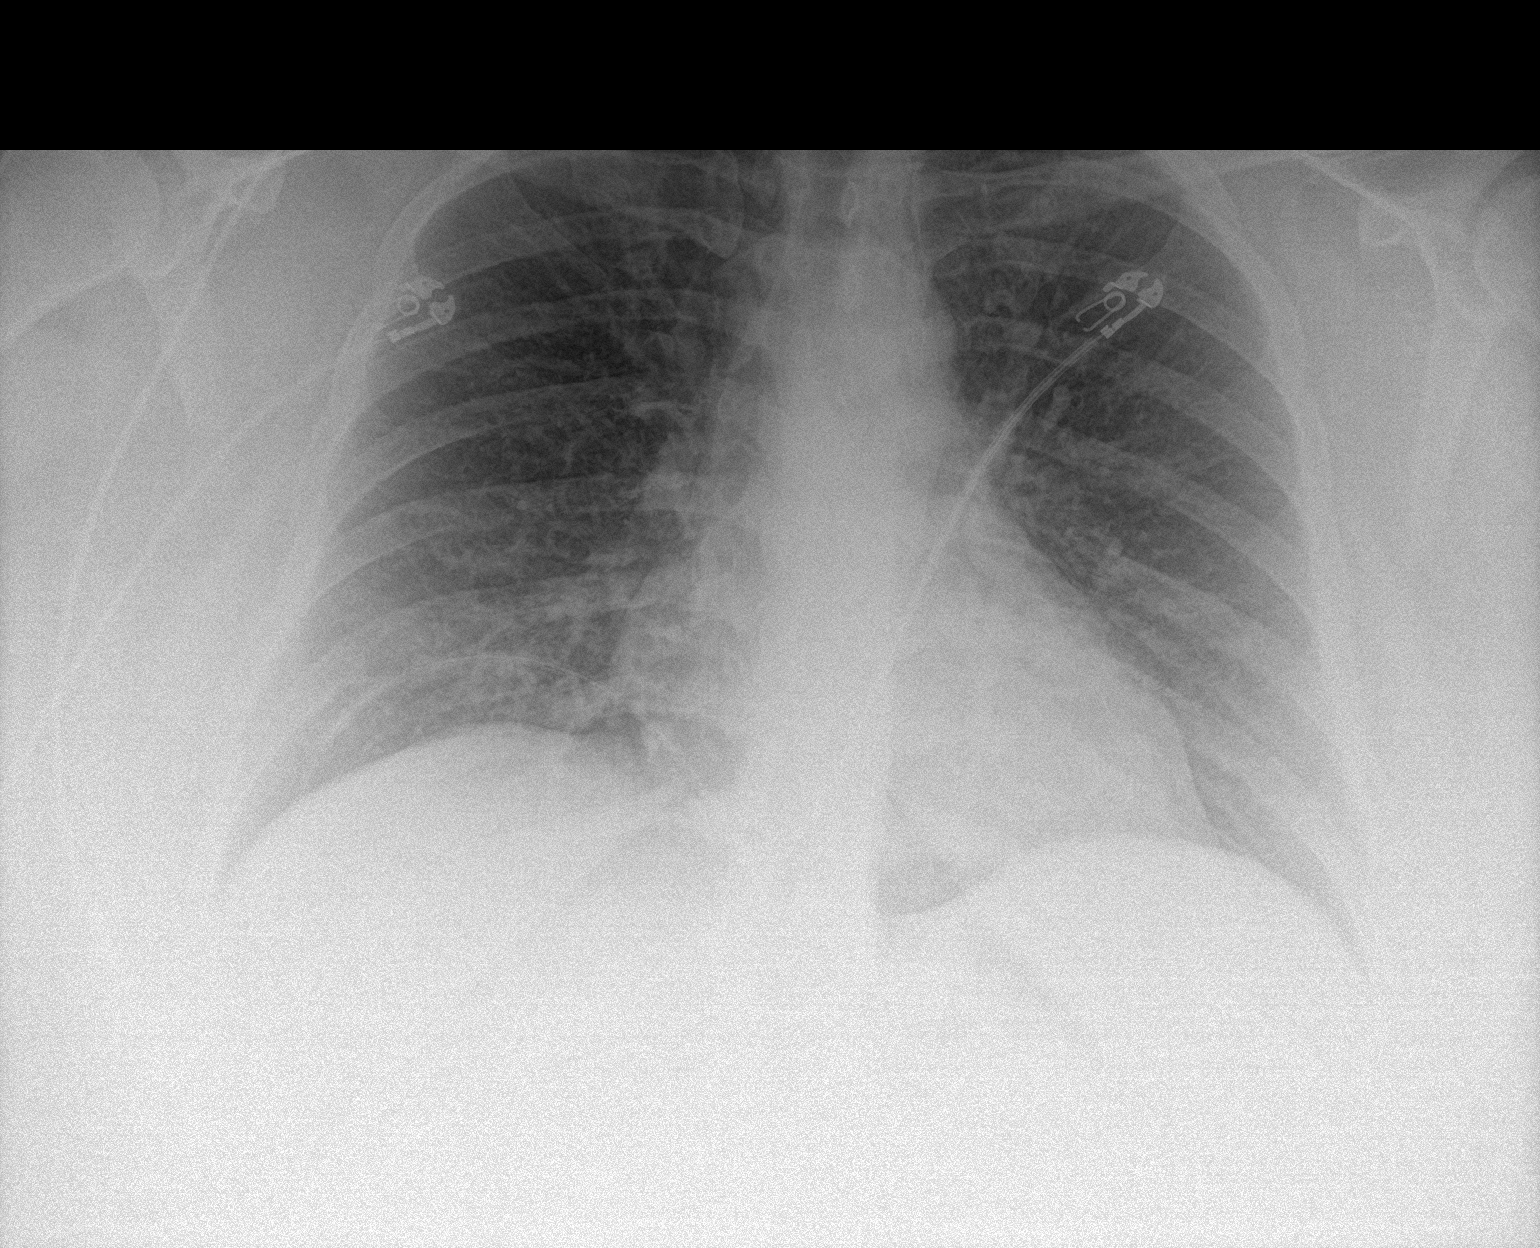

[2 of 2 positions shown; findings below may reference images not displayed]

FINDINGS: Upper normal heart size. Stable mediastinal contours. There is
diffuse peribronchial thickening. No focal airspace disease. No
pleural fluid or pneumothorax. No acute osseous abnormalities are
seen.
IMPRESSION: Diffuse peribronchial thickening consistent with asthma or
bronchitis.

## 2022-03-16 IMAGING — DX DG CHEST 2V
2 series · 2 of 2 positions shown · non-contrast
Comparison: 12/04/2020

CLINICAL DATA: Chest pain since this morning.

EXAM:
CHEST - 2 VIEW

[chest pa]
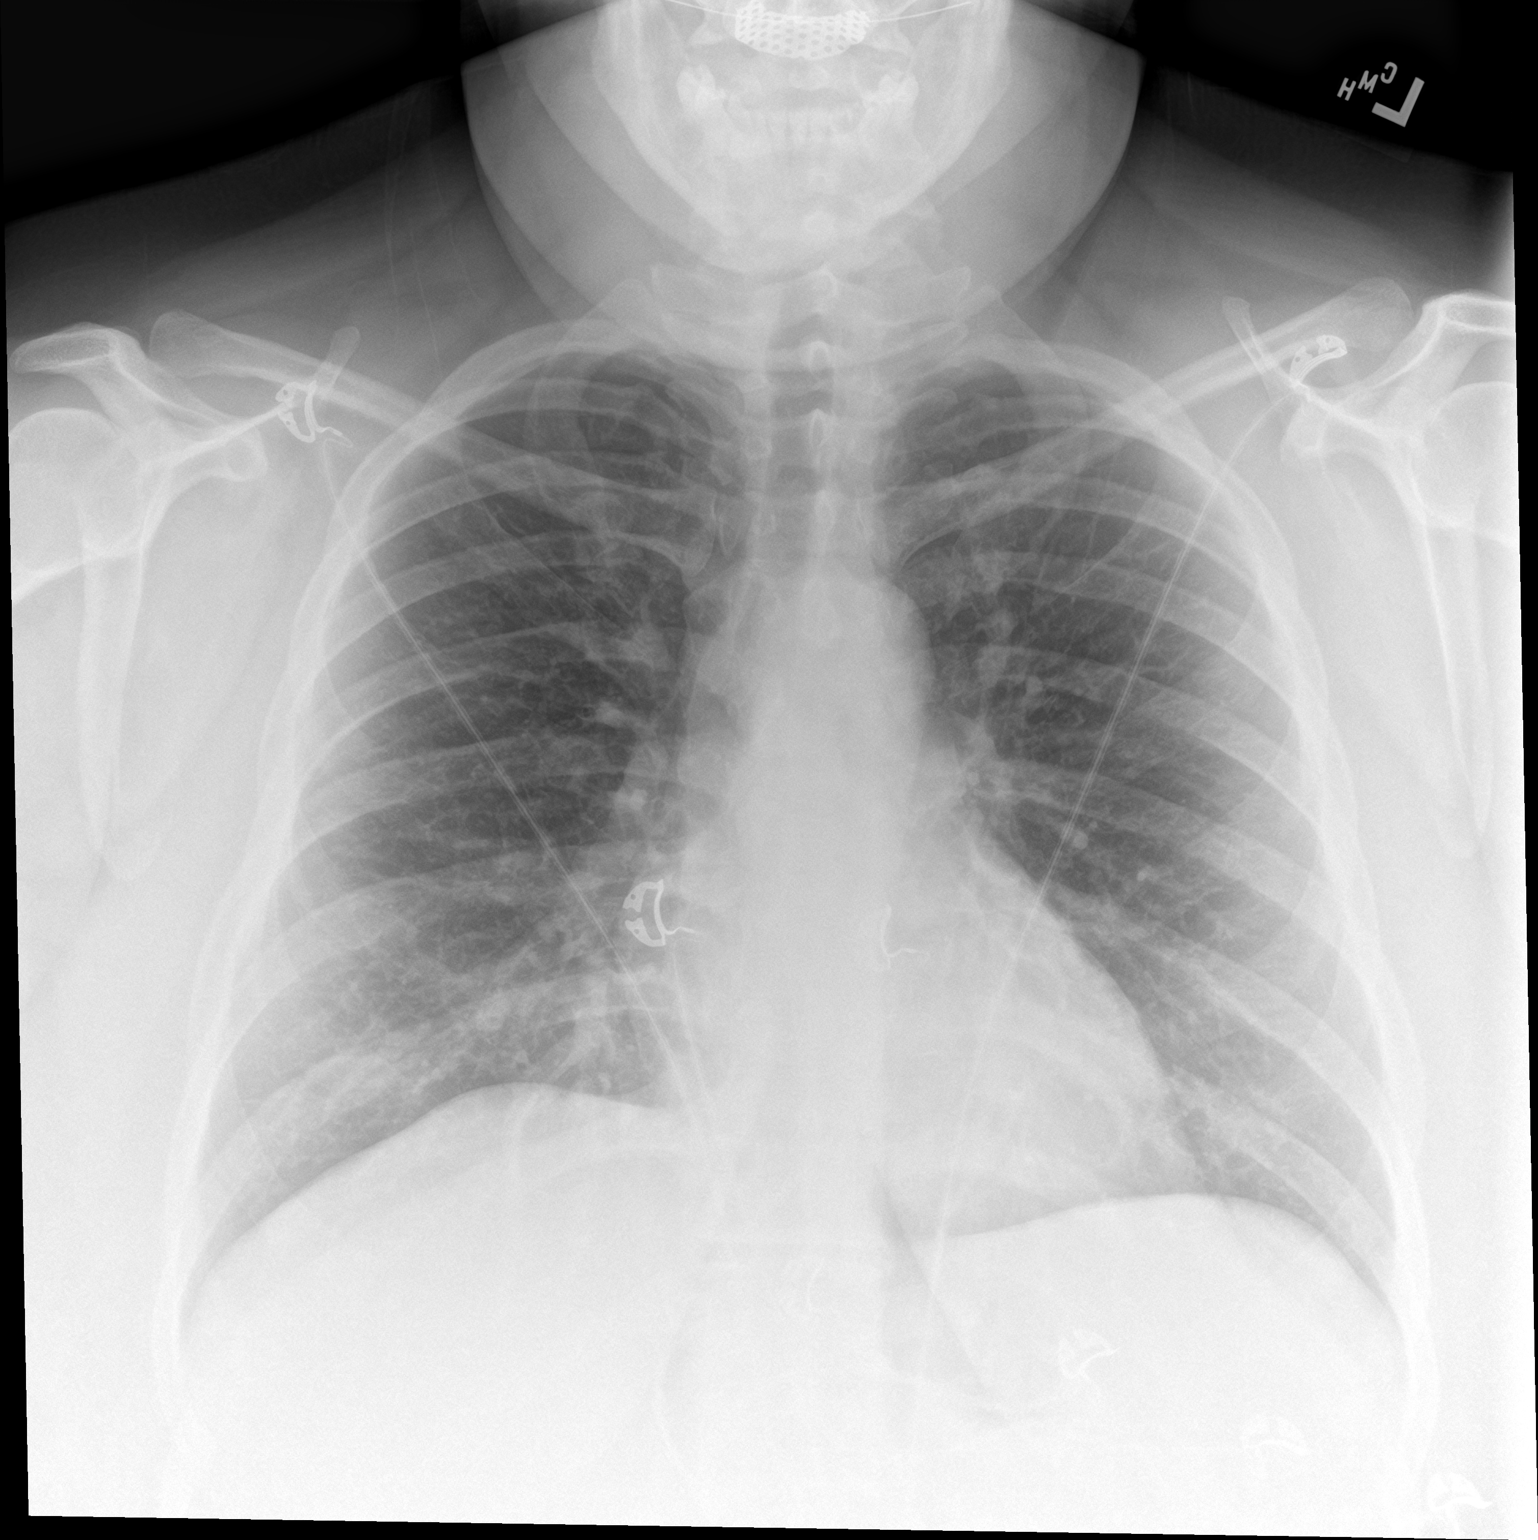

[chest lat]
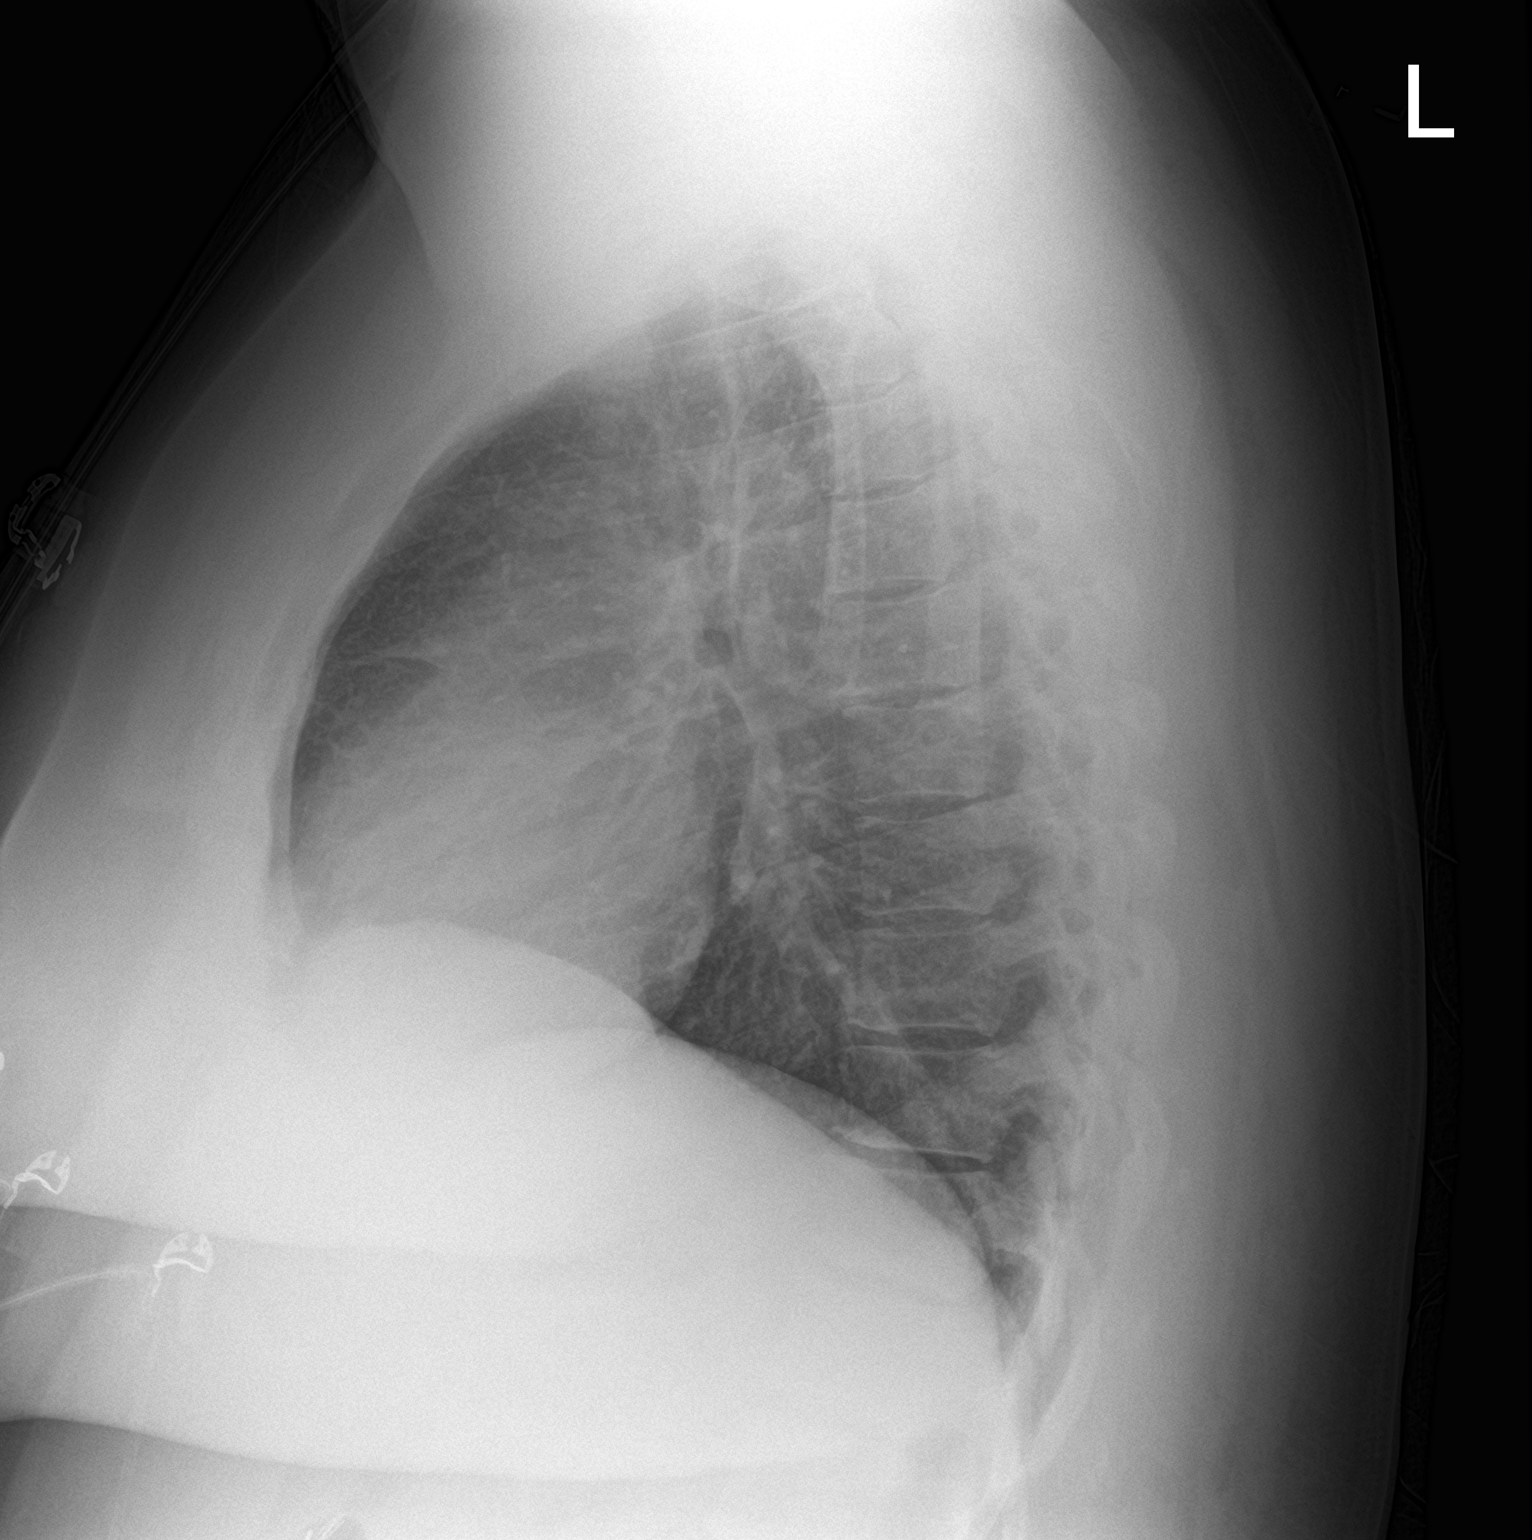

[2 of 2 positions shown; findings below may reference images not displayed]

FINDINGS: The cardiac silhouette, mediastinal and hilar contours are normal.
The lungs are clear. No pleural effusions. No pulmonary lesions. The
bony thorax is intact.
IMPRESSION: No acute cardiopulmonary findings.
